# Patient Record
Sex: Male | Born: 1985 | Race: White | Hispanic: Yes | Marital: Single | Smoking: Never smoker
Health system: Southern US, Community
[De-identification: ages and names within clinical notes are randomized; demographics above are authoritative.]

## PROBLEM LIST (undated history)

## (undated) DIAGNOSIS — J45909 Unspecified asthma, uncomplicated: Secondary | ICD-10-CM

## (undated) DIAGNOSIS — N452 Orchitis: Secondary | ICD-10-CM

## (undated) DIAGNOSIS — N200 Calculus of kidney: Secondary | ICD-10-CM

## (undated) DIAGNOSIS — E559 Vitamin D deficiency, unspecified: Secondary | ICD-10-CM

## (undated) HISTORY — DX: Orchitis: N45.2

## (undated) HISTORY — DX: Unspecified asthma, uncomplicated: J45.909

## (undated) HISTORY — PX: EYE SURGERY: SHX253

## (undated) HISTORY — DX: Calculus of kidney: N20.0

## (undated) HISTORY — DX: Vitamin D deficiency, unspecified: E55.9

## (undated) HISTORY — PX: WISDOM TOOTH EXTRACTION: SHX21

---
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
## 2007-03-05 HISTORY — PX: VITRECTOMY: SHX106

## 2007-04-16 ENCOUNTER — Emergency Department (HOSPITAL_COMMUNITY): Admission: EM | Admit: 2007-04-16 | Discharge: 2007-04-17 | Payer: Self-pay | Admitting: Emergency Medicine

## 2007-10-22 ENCOUNTER — Emergency Department (HOSPITAL_COMMUNITY): Admission: EM | Admit: 2007-10-22 | Discharge: 2007-10-22 | Payer: Self-pay | Admitting: Emergency Medicine

## 2010-09-27 ENCOUNTER — Emergency Department (HOSPITAL_COMMUNITY): Payer: BC Managed Care – HMO

## 2010-09-27 ENCOUNTER — Emergency Department (HOSPITAL_COMMUNITY)
Admission: EM | Admit: 2010-09-27 | Discharge: 2010-09-27 | Disposition: A | Discharge status: Home / Self Care | Payer: BC Managed Care – HMO | Attending: Emergency Medicine | Admitting: Emergency Medicine

## 2010-09-27 DIAGNOSIS — R1032 Left lower quadrant pain: Secondary | ICD-10-CM | POA: Insufficient documentation

## 2010-09-27 DIAGNOSIS — J45909 Unspecified asthma, uncomplicated: Secondary | ICD-10-CM | POA: Insufficient documentation

## 2010-09-27 DIAGNOSIS — R110 Nausea: Secondary | ICD-10-CM | POA: Insufficient documentation

## 2010-09-27 DIAGNOSIS — R109 Unspecified abdominal pain: Secondary | ICD-10-CM | POA: Insufficient documentation

## 2010-09-27 DIAGNOSIS — N200 Calculus of kidney: Secondary | ICD-10-CM | POA: Insufficient documentation

## 2010-09-27 LAB — POCT I-STAT, CHEM 8
Creatinine, Ser: 1 mg/dL (ref 0.4–1.5)
Glucose, Bld: 99 mg/dL (ref 70–99)
HCT: 44 % (ref 39.0–52.0)
Hemoglobin: 15 g/dL (ref 13.0–17.0)
Potassium: 3.9 mEq/L (ref 3.5–5.1)
Sodium: 142 mEq/L (ref 135–145)
TCO2: 23 mmol/L (ref 0–100)

## 2010-09-27 LAB — DIFFERENTIAL
Basophils Absolute: 0 10*3/uL (ref 0.0–0.1)
Basophils Relative: 0 % (ref 0–1)
Eosinophils Relative: 1 % (ref 0–5)
Lymphocytes Relative: 15 % (ref 12–46)
Monocytes Absolute: 0.5 10*3/uL (ref 0.1–1.0)
Neutro Abs: 8.4 10*3/uL — ABNORMAL HIGH (ref 1.7–7.7)

## 2010-09-27 LAB — URINALYSIS, ROUTINE W REFLEX MICROSCOPIC
Leukocytes, UA: NEGATIVE
Protein, ur: 30 mg/dL — AB
Specific Gravity, Urine: 1.023 (ref 1.005–1.030)
Urine Glucose, Fasting: NEGATIVE mg/dL

## 2010-09-27 LAB — URINE MICROSCOPIC-ADD ON

## 2010-09-27 LAB — CBC
MCHC: 33.7 g/dL (ref 30.0–36.0)
RDW: 11.6 % (ref 11.5–15.5)
WBC: 10.7 10*3/uL — ABNORMAL HIGH (ref 4.0–10.5)

## 2010-12-20 NOTE — Op Note (Signed)
NAMEINDIGO, Alan Beck             ACCOUNT NO.:  [id number]   MEDICAL RECORD NO.:  [id number]          PATIENT TYPE:  INP   LOCATION:  1824                         FACILITY:  [facility]   PHYSICIAN:  Leonia Corona, M.D.  DATE OF BIRTH:  [birth year]   DATE OF PROCEDURE:  08/01/2005  DATE OF DISCHARGE:                                 OPERATIVE REPORT   PREOPERATIVE DIAGNOSIS:  Acute appendicitis.   POSTOPERATIVE DIAGNOSIS:  Acute appendicitis.   PROCEDURE:  Open appendectomy.   ANESTHESIA:  General laryngeal mask endotracheal anesthesia.   SURGEON:  Leonia Corona, M.D.   ASSISTANT:  Nurse.   INDICATIONS FOR PROCEDURE:  This 25 year old male child was evaluated for  lower abdominal pain which was suspicious for acute appendicitis.  The  diagnosis was confirmed on CT scan, hence the indication for the procedure.   DESCRIPTION OF PROCEDURE:  The patient was brought into the operating room  and placed supine on the operating table.  General endotracheal anesthesia  was given.  The right lower quadrant of the abdomen and the surrounding area  of the abdominal wall was cleaned, prepped, and draped in the usual manner.   The incision was made centered at McBurney's point at the right lower  quadrant and measured about 3 to 4 cm along the skin crease. The incision  was deepened through the subcutaneous tissues with electrocautery until the  external aponeurosis was reached.  The external oblique was incised in the  line of its fibers.  The internal oblique and transversus abdominis muscles  were split along its fibers with the help of a blunted hemostats and  retracted with the [employer] retractors.  The peritoneum was exposed which  was held between two hemostats and incised in between with scissors.  A  small opening into the peritoneal cavity was made.  The peritoneal fluid was  swabbed for cultures and sensitivity.  The opening into the peritoneal  cavity was enlarged with scissors.   The retractors were inserted.  The cecum  was identified, and tinea was followed which led to a swollen and inflamed  appendix.  The appendix was delivered out of the incision along with partial  delivery of the cecal wall.  With the help of a Babcock forceps, the  edematous and indurated mesoappendix was divided between clamps and ligated  using 2-0 silk until the base of the appendix was clear.  The base of the  appendix was crushed with a clamp and clamped above the base.  The base was  ligated using 2-0 Vicryl.  The appendix was divided above the ligature and  removed from the field.  The appendiceal stump mucosa was cauterized.  A  pursestring suture using 3-0 silk was placed on the cecal wall around the  base of the appendix, and the appendiceal stump was buried by tying the  pursestring suture.  The cecum was returned back into the peritoneal cavity.  Peritoneal lavage using warm normal saline was done until the returning  fluid was clear.  No oozing or bleeding was noted.  The abdomen was closed  in layers.  The  peritoneum was closed using 2-0 Vicryl running stitch.  The  external oblique and transverse abdominis muscles were approximated using a  single stitch of 2-0 Vicryl.  The external oblique aponeurosis was repaired  using 2-0 Vicryl interrupted stitch.  Approximately 20 cc of 0.25% Marcaine  with epinephrine was infiltrated in and around the incision for  postoperative pain control.  The skin was closed in two layers, the deep  subcutaneous layer using 4-0 Vicryl and the skin with 5-0 Monocryl  subcuticular stitch.  Steri-Strips were applied which were covered with  sterile gauze and Tegaderm dressing.   The patient tolerated the procedure very well, which was smooth and  uneventful.  The patient was later extubated and transported to the recovery  room in good and stable condition.      Leonia Corona, M.D.  Electronically Signed     SF/MEDQ  D:  08/01/2005  T:   08/01/2005  Job:  [id number]   cc:   Linward Headland, M.D.  Fax: [phone]

## 2011-04-28 LAB — POCT URINALYSIS DIP (DEVICE)
Bilirubin Urine: NEGATIVE
Glucose, UA: NEGATIVE
Nitrite: NEGATIVE
Operator id: 247071
Urobilinogen, UA: 1

## 2011-08-05 DIAGNOSIS — N452 Orchitis: Secondary | ICD-10-CM

## 2011-08-05 HISTORY — DX: Orchitis: N45.2

## 2016-08-26 ENCOUNTER — Encounter: Payer: Self-pay | Admitting: Internal Medicine

## 2016-08-26 ENCOUNTER — Ambulatory Visit (INDEPENDENT_AMBULATORY_CARE_PROVIDER_SITE_OTHER): Payer: 59 | Admitting: Internal Medicine

## 2016-08-26 VITALS — BP 112/76 | HR 80 | Temp 97.9°F | Resp 16 | Ht 69.5 in | Wt 153.8 lb

## 2016-08-26 DIAGNOSIS — Z0000 Encounter for general adult medical examination without abnormal findings: Secondary | ICD-10-CM | POA: Diagnosis not present

## 2016-08-26 DIAGNOSIS — R5383 Other fatigue: Secondary | ICD-10-CM

## 2016-08-26 DIAGNOSIS — Z131 Encounter for screening for diabetes mellitus: Secondary | ICD-10-CM

## 2016-08-26 DIAGNOSIS — Z111 Encounter for screening for respiratory tuberculosis: Secondary | ICD-10-CM | POA: Diagnosis not present

## 2016-08-26 DIAGNOSIS — Z136 Encounter for screening for cardiovascular disorders: Secondary | ICD-10-CM

## 2016-08-26 DIAGNOSIS — Z1211 Encounter for screening for malignant neoplasm of colon: Secondary | ICD-10-CM

## 2016-08-26 DIAGNOSIS — Z23 Encounter for immunization: Secondary | ICD-10-CM | POA: Diagnosis not present

## 2016-08-26 DIAGNOSIS — Z0001 Encounter for general adult medical examination with abnormal findings: Secondary | ICD-10-CM

## 2016-08-26 DIAGNOSIS — Z13220 Encounter for screening for lipoid disorders: Secondary | ICD-10-CM

## 2016-08-26 DIAGNOSIS — E559 Vitamin D deficiency, unspecified: Secondary | ICD-10-CM

## 2016-08-26 DIAGNOSIS — Z79899 Other long term (current) drug therapy: Secondary | ICD-10-CM

## 2016-08-26 NOTE — Patient Instructions (Signed)

## 2016-08-26 NOTE — Progress Notes (Signed)
[facility]   Lucky Cowboy, M.D.    Dyanne Carrel. Steffanie Dunn, P.A.-C      Terri Piedra, P.A.-C  [facility]                [address]                [place], [place]. [address] Telephone [phone] Telefax [phone] Annual  Screening/Preventative Visit  & Comprehensive Evaluation & Examination     This very nice 31 y.o. SWM presents for a Screening/Preventative Visit & comprehensive evaluation and management of multiple medical co-morbidities.  Patient is expectantly evaluated for elevated BP, Prediabetes, Hyperlipidemia and Vitamin D Deficiency.     Today's BP is at goal - 112/76. Patient denies any cardiac symptoms as chest pain, palpitations, shortness of breath, dizziness or ankle swelling.     Patient's hyperlipidemia is screened expectantly for abnormal lipids and  prediabetes and patient denies reactive hypoglycemic symptoms, visual blurring, diabetic polys or paresthesias.      Finally, patient has history of Vitamin D Deficiency in 2009 of "25".   Current meds/supplements - None  Allergies  Allergen Reactions  . Cephalosporins Rash   Past Medical History:  Diagnosis Date  . Asthma   . Chronic kidney disease 2012   kidney stones   . Orchitis, left 2013   Dr Isabel Caprice  . Vitamin D deficiency    Health Maintenance  Topic Date Due  . HIV Screening  10/28/2001  . TETANUS/TDAP  10/28/2005  . INFLUENZA VACCINE  03/04/2016   Immunization History  Administered Date(s) Administered  . PPD Test 08/26/2016  . Tdap 08/26/2016   Past Surgical History:  Procedure Laterality Date  . APPENDECTOMY  2007  . EYE SURGERY Right 03/2007   Rt Retinal Detachment & Rt CE/IOL   Family History  Problem Relation Age of Onset  . Cancer Maternal Aunt   . Cancer Maternal Uncle   . Hypertension Paternal Grandmother   . Hypertension Paternal Grandfather   . Cancer Cousin   . Seizures Cousin    Social History    Social History  . Marital status: Single  . Years of education: N/A   Occupational History  .  employer x 4  Years as an Transport planner at [employer]   Social History Main Topics  . Smoking status: No  . Smokeless tobacco: No  . Alcohol use No  . Drug use: No  . Sexual activity: Not on file    ROS Constitutional: Denies fever, chills, weight loss/gain, headaches, insomnia,  night sweats or change in appetite. Does c/o fatigue. Eyes: Denies redness, blurred vision, diplopia, discharge, itchy or watery eyes.  ENT: Denies discharge, congestion, post nasal drip, epistaxis, sore throat, earache, hearing loss, dental pain, Tinnitus, Vertigo, Sinus pain or snoring.  Cardio: Denies chest pain, palpitations, irregular heartbeat, syncope, dyspnea, diaphoresis, orthopnea, PND, claudication or edema Respiratory: denies cough, dyspnea, DOE, pleurisy, hoarseness, laryngitis or wheezing.  Gastrointestinal: Denies dysphagia, heartburn, reflux, water brash, pain, cramps, nausea, vomiting, bloating, diarrhea, constipation, hematemesis, melena, hematochezia, jaundice or hemorrhoids Genitourinary: Denies dysuria, frequency, urgency, nocturia, hesitancy, discharge, hematuria or flank pain Musculoskeletal: Denies arthralgia, myalgia, stiffness, Jt. Swelling, pain, limp or strain/sprain. Denies Falls. Skin: Denies puritis, rash, hives, warts, acne, eczema or change in skin lesion Neuro: No weakness, tremor, incoordination, spasms, paresthesia or pain Psychiatric: Denies confusion, memory loss or sensory loss. Denies Depression. Endocrine: Denies change in weight, skin, hair change, nocturia, and  paresthesia, diabetic polys, visual blurring or hyper / hypo glycemic episodes.  Heme/Lymph: No excessive bleeding, bruising or enlarged lymph nodes.  Physical Exam  BP 112/76   Pulse 80   Temp 97.9 F (36.6 C)   Resp 16   Ht 5' 9.5" (1.765 m)   Wt 153 lb 12.8 oz (69.8 kg)   BMI 22.39 kg/m   General  Appearance: Well nourished, in no apparent distress.  Eyes: PERRLA, EOMs, conjunctiva no swelling or erythema, normal fundi and vessels. Sinuses: No frontal/maxillary tenderness ENT/Mouth: EACs patent / TMs  nl. Nares clear without erythema, swelling, mucoid exudates. Oral hygiene is good. No erythema, swelling, or exudate. Tongue normal, non-obstructing. Tonsils not swollen or erythematous. Hearing normal.  Neck: Supple, thyroid normal. No bruits, nodes or JVD. Respiratory: Respiratory effort normal.  BS equal and clear bilateral without rales, rhonci, wheezing or stridor. Cardio: Heart sounds are normal with regular rate and rhythm and no murmurs, rubs or gallops. Peripheral pulses are normal and equal bilaterally without edema. No aortic or femoral bruits. Chest: symmetric with normal excursions and percussion.  Abdomen: Soft, with Nl bowel sounds. Nontender, no guarding, rebound, hernias, masses, or organomegaly.  Lymphatics: Non tender without lymphadenopathy.  Genitourinary: No hernias.Testes nl. DRE - deferred for age Musculoskeletal: Full ROM all peripheral extremities, joint stability, 5/5 strength, and normal gait. Skin: Warm and dry without rashes, lesions, cyanosis, clubbing or  ecchymosis.  Neuro: Cranial nerves intact, reflexes equal bilaterally. Normal muscle tone, no cerebellar symptoms. Sensation intact.  Pysch: Alert and oriented X 3 with normal affect, insight and judgment appropriate.   Assessment and Plan  1. Annual Preventative/Screening Exam    2. Hypertension screen  - Microalbumin / creatinine urine ratio - EKG 12-Lead - Urinalysis, Routine w reflex microscopic - BASIC METABOLIC PANEL WITH GFR - TSH  3. Screening cholesterol level  - EKG 12-Lead - Hepatic function panel - Lipid panel - TSH  4. Vitamin D deficiency  - Hemoglobin A1c - Insulin, random  5. Colon cancer screening  - POC Hemoccult Bld/Stl    6. Screening for ischemic heart  disease  - EKG 12-Lead  7. Fatigue, unspecified type  - Vitamin B12 - Testosterone - CBC with Differential/Platelet - TSH - Iron and TIBC  8. Medication management  - Urinalysis, Routine w reflex microscopic - CBC with Differential/Platelet - BASIC METABOLIC PANEL WITH GFR - Hepatic function panel - Magnesium  9. Screening examination for pulmonary tuberculosis  - PPD  10. Need for prophylactic vaccination with combined diphtheria-tetanus-pertussis (DTP) vaccine  - Tdap vaccine greater than or equal to 7yo IM        Continue prudent diet as discussed, weight control, BP monitoring, regular exercise, and medications as discussed.  Discussed med effects and SE's. Routine screening labs and tests as requested with regular follow-up as recommended. Over 40 minutes of exam, counseling, chart review and high complex critical decision making was performed

## 2016-08-27 LAB — MAGNESIUM: MAGNESIUM: 2.1 mg/dL (ref 1.5–2.5)

## 2016-08-27 LAB — URINALYSIS, ROUTINE W REFLEX MICROSCOPIC
BILIRUBIN URINE: NEGATIVE
GLUCOSE, UA: NEGATIVE
HGB URINE DIPSTICK: NEGATIVE
KETONES UR: NEGATIVE
Leukocytes, UA: NEGATIVE
Nitrite: NEGATIVE
PROTEIN: NEGATIVE
Specific Gravity, Urine: 1.017 (ref 1.001–1.035)
pH: 7.5 (ref 5.0–8.0)

## 2016-08-27 LAB — HEPATIC FUNCTION PANEL
ALBUMIN: 4.5 g/dL (ref 3.6–5.1)
ALK PHOS: 50 U/L (ref 40–115)
ALT: 9 U/L (ref 9–46)
AST: 11 U/L (ref 10–40)
BILIRUBIN TOTAL: 0.5 mg/dL (ref 0.2–1.2)
Bilirubin, Direct: 0.1 mg/dL (ref <=0.2)
Indirect Bilirubin: 0.4 mg/dL (ref 0.2–1.2)
TOTAL PROTEIN: 7 g/dL (ref 6.1–8.1)

## 2016-08-27 LAB — BASIC METABOLIC PANEL WITH GFR
BUN: 16 mg/dL (ref 7–25)
CHLORIDE: 103 mmol/L (ref 98–110)
CO2: 24 mmol/L (ref 20–31)
CREATININE: 0.94 mg/dL (ref 0.60–1.35)
Calcium: 9.6 mg/dL (ref 8.6–10.3)
GFR, Est Non African American: >89 mL/min (ref >=60)
GLUCOSE: 89 mg/dL (ref 65–99)
POTASSIUM: 4 mmol/L (ref 3.5–5.3)
Sodium: 143 mmol/L (ref 135–146)

## 2016-08-27 LAB — CBC WITH DIFFERENTIAL/PLATELET
BASOS ABS: 67 {cells}/uL (ref 0–200)
BASOS PCT: 1 %
EOS ABS: 268 {cells}/uL (ref 15–500)
Eosinophils Relative: 4 %
HEMATOCRIT: 45.2 % (ref 38.5–50.0)
Hemoglobin: 15.2 g/dL (ref 13.2–17.1)
LYMPHS PCT: 31 %
Lymphs Abs: 2077 cells/uL (ref 850–3900)
MCH: 30.2 pg (ref 27.0–33.0)
MCHC: 33.6 g/dL (ref 32.0–36.0)
MCV: 89.7 fL (ref 80.0–100.0)
MONO ABS: 469 {cells}/uL (ref 200–950)
MONOS PCT: 7 %
MPV: 9.8 fL (ref 7.5–12.5)
NEUTROS PCT: 57 %
Neutro Abs: 3819 cells/uL (ref 1500–7800)
PLATELETS: 326 10*3/uL (ref 140–400)
RBC: 5.04 MIL/uL (ref 4.20–5.80)
RDW: 13.2 % (ref 11.0–15.0)
WBC: 6.7 10*3/uL (ref 3.8–10.8)

## 2016-08-27 LAB — HEMOGLOBIN A1C
Hgb A1c MFr Bld: 4.9 % (ref <5.7)
MEAN PLASMA GLUCOSE: 94 mg/dL

## 2016-08-27 LAB — LIPID PANEL
Cholesterol: 137 mg/dL (ref <200)
HDL: 39 mg/dL — ABNORMAL LOW (ref >40)
LDL CALC: 57 mg/dL (ref <100)
Total CHOL/HDL Ratio: 3.5 Ratio (ref <5.0)
Triglycerides: 205 mg/dL — ABNORMAL HIGH (ref <150)
VLDL: 41 mg/dL — ABNORMAL HIGH (ref <30)

## 2016-08-27 LAB — INSULIN, RANDOM: INSULIN: 35 u[IU]/mL — AB (ref 2.0–19.6)

## 2016-08-27 LAB — IRON AND TIBC
%SAT: 33 % (ref 15–60)
IRON: 104 ug/dL (ref 50–195)
TIBC: 320 ug/dL (ref 250–425)
UIBC: 216 ug/dL (ref 125–400)

## 2016-08-27 LAB — VITAMIN B12: VITAMIN B 12: 447 pg/mL (ref 200–1100)

## 2016-08-27 LAB — VITAMIN D 25 HYDROXY (VIT D DEFICIENCY, FRACTURES): VIT D 25 HYDROXY: 17 ng/mL — AB (ref 30–100)

## 2016-08-27 LAB — MICROALBUMIN / CREATININE URINE RATIO
CREATININE, URINE: 105 mg/dL (ref 20–370)
MICROALB UR: 0.4 mg/dL
MICROALB/CREAT RATIO: 4 ug/mg{creat} (ref <30)

## 2016-08-27 LAB — TESTOSTERONE: Testosterone: 330 ng/dL (ref 250–827)

## 2016-08-27 LAB — TSH: TSH: 1.77 m[IU]/L (ref 0.40–4.50)

## 2016-08-29 ENCOUNTER — Encounter: Payer: Self-pay | Admitting: *Deleted

## 2016-09-19 ENCOUNTER — Encounter: Payer: Self-pay | Admitting: Internal Medicine

## 2016-10-09 DIAGNOSIS — H04123 Dry eye syndrome of bilateral lacrimal glands: Secondary | ICD-10-CM | POA: Diagnosis not present

## 2016-10-09 DIAGNOSIS — H2701 Aphakia, right eye: Secondary | ICD-10-CM | POA: Diagnosis not present

## 2016-10-09 DIAGNOSIS — H43812 Vitreous degeneration, left eye: Secondary | ICD-10-CM | POA: Diagnosis not present

## 2016-12-25 ENCOUNTER — Encounter: Payer: Self-pay | Admitting: Physician Assistant

## 2016-12-25 ENCOUNTER — Ambulatory Visit (INDEPENDENT_AMBULATORY_CARE_PROVIDER_SITE_OTHER): Payer: 59 | Admitting: Physician Assistant

## 2016-12-25 VITALS — BP 116/68 | HR 88 | Temp 97.9°F | Resp 16 | Ht 69.5 in | Wt 153.4 lb

## 2016-12-25 DIAGNOSIS — K5900 Constipation, unspecified: Secondary | ICD-10-CM | POA: Diagnosis not present

## 2016-12-25 DIAGNOSIS — K2100 Gastro-esophageal reflux disease with esophagitis, without bleeding: Secondary | ICD-10-CM

## 2016-12-25 DIAGNOSIS — R2991 Unspecified symptoms and signs involving the musculoskeletal system: Secondary | ICD-10-CM | POA: Diagnosis not present

## 2016-12-25 DIAGNOSIS — R51 Headache: Secondary | ICD-10-CM | POA: Diagnosis not present

## 2016-12-25 DIAGNOSIS — R0600 Dyspnea, unspecified: Secondary | ICD-10-CM | POA: Diagnosis not present

## 2016-12-25 LAB — CBC WITH DIFFERENTIAL/PLATELET
BASOS ABS: 62 {cells}/uL (ref 0–200)
BASOS PCT: 1 %
EOS PCT: 8 %
Eosinophils Absolute: 496 cells/uL (ref 15–500)
HEMATOCRIT: 42.5 % (ref 38.5–50.0)
HEMOGLOBIN: 14.5 g/dL (ref 13.2–17.1)
LYMPHS ABS: 1860 {cells}/uL (ref 850–3900)
Lymphocytes Relative: 30 %
MCH: 30.4 pg (ref 27.0–33.0)
MCHC: 34.1 g/dL (ref 32.0–36.0)
MCV: 89.1 fL (ref 80.0–100.0)
MONOS PCT: 8 %
MPV: 9.3 fL (ref 7.5–12.5)
Monocytes Absolute: 496 cells/uL (ref 200–950)
NEUTROS ABS: 3286 {cells}/uL (ref 1500–7800)
Neutrophils Relative %: 53 %
Platelets: 278 10*3/uL (ref 140–400)
RBC: 4.77 MIL/uL (ref 4.20–5.80)
RDW: 13.3 % (ref 11.0–15.0)
WBC: 6.2 10*3/uL (ref 3.8–10.8)

## 2016-12-25 LAB — BASIC METABOLIC PANEL WITH GFR
BUN: 16 mg/dL (ref 7–25)
CO2: 24 mmol/L (ref 20–31)
Calcium: 9.3 mg/dL (ref 8.6–10.3)
Chloride: 109 mmol/L (ref 98–110)
Creat: 1.11 mg/dL (ref 0.60–1.35)
GFR, Est Non African American: 89 mL/min (ref >=60)
GLUCOSE: 91 mg/dL (ref 65–99)
POTASSIUM: 4.2 mmol/L (ref 3.5–5.3)
Sodium: 143 mmol/L (ref 135–146)

## 2016-12-25 LAB — HEPATIC FUNCTION PANEL
ALK PHOS: 49 U/L (ref 40–115)
ALT: 9 U/L (ref 9–46)
AST: 11 U/L (ref 10–40)
Albumin: 4.3 g/dL (ref 3.6–5.1)
BILIRUBIN INDIRECT: 0.5 mg/dL (ref 0.2–1.2)
Bilirubin, Direct: 0.1 mg/dL (ref <=0.2)
TOTAL PROTEIN: 6.8 g/dL (ref 6.1–8.1)
Total Bilirubin: 0.6 mg/dL (ref 0.2–1.2)

## 2016-12-25 NOTE — Progress Notes (Signed)
Subjective:    Patient ID: Alan Beck, male    DOB: 05-01-1986, 31 y.o.   MRN: [id number]  HPI 31 y.o. WM with history of asthma presents tightness/choking issue at neck and through his chest x 2 weeks. Feels lump in his throat, will feel burning sensation in his throat like breathing in cold air, will eat RX bar/diet pepsi for breakfast and will be sitting at his desk, nonexertional, has trouble getting deep breath, no sweating, no CP, no dizziness. Constipation 2 weeks ago, on senna plus and miralax that helped.  No aleve, ibuprofen. 3 weeks ago had wedding with some beer there but no other ETOH use.   He has pectas exacavum, long fingers, history of retinal detachment, arm length is greather than height. No family history of marfan's but has several physical features.    Blood pressure 116/68, pulse 88, temperature 97.9 F (36.6 C), resp. rate 16, height 5' 9.5" (1.765 m), weight 153 lb 6.4 oz (69.6 kg), SpO2 98 %.  Medications No current outpatient prescriptions on file prior to visit.   No current facility-administered medications on file prior to visit.     Problem list He  does not have a problem list on file.   Review of Systems  Constitutional: Negative for chills, fatigue and fever.  HENT: Negative.   Respiratory: Positive for chest tightness. Negative for apnea, cough, choking, shortness of breath, wheezing and stridor.   Cardiovascular: Negative for chest pain, palpitations and leg swelling.  Gastrointestinal: Positive for constipation and nausea. Negative for abdominal distention, abdominal pain, anal bleeding, blood in stool, diarrhea, rectal pain and vomiting.  Genitourinary: Negative.   Musculoskeletal: Negative.   Skin: Negative.  Negative for rash.  Neurological: Negative.        Objective:   Physical Exam  Constitutional: He is oriented to person, place, and time. He appears well-developed and well-nourished. No distress.  HENT:  Head: Normocephalic  and atraumatic.  Eyes: Conjunctivae are normal. Pupils are equal, round, and reactive to light.  Wears glasses  Neck: Normal range of motion. Neck supple.  Cardiovascular: Normal rate, regular rhythm, normal heart sounds and intact distal pulses.   No murmur heard. Pulmonary/Chest: Effort normal and breath sounds normal. He has no wheezes. He has no rales.  Pectus excavatum  Abdominal: Soft. Bowel sounds are normal. He exhibits no mass. There is tenderness (periumbilical and epigasric). There is no rebound and no guarding.  Musculoskeletal: Normal range of motion. He exhibits no tenderness.  + hand and thumb test, wing span longer than height, double jointed/laxity.  Lymphadenopathy:    He has no cervical adenopathy.  Neurological: He is alert and oriented to person, place, and time. No cranial nerve deficit.  Skin: Skin is warm and dry. No rash noted.      Assessment & Plan:  1. Constipation, unspecified constipation type Increase fiber, may need IBS-C med If not better will refer to GI - Celiac Disease Comprehensive Panel with Reflexes  2. Gastroesophageal reflux disease with esophagitis Treat with PPI x 2 weeks, diet given, follow up if worse  3. Dyspnea, unspecified type Nonexertional, ? 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 The patient voices understanding of current treatment options and is in agreement with the current care plan.The patient knows to call the clinic with any problems, questions or concerns or go to the ER if any further progression of symptoms.

## 2016-12-25 NOTE — Patient Instructions (Addendum)
Food Choices for Gastroesophageal Reflux Disease, Adult When you have gastroesophageal reflux disease (GERD), the foods you eat and your eating habits are very important. Choosing the right foods can help ease your discomfort. What guidelines do I need to follow?  Choose fruits, vegetables, whole grains, and low-fat dairy products.  Choose low-fat meat, fish, and poultry.  Limit fats such as oils, salad dressings, butter, nuts, and avocado.  Keep a food diary. This helps you identify foods that cause symptoms.  Avoid foods that cause symptoms. These may be different for everyone.  Eat small meals often instead of 3 large meals a day.  Eat your meals slowly, in a place where you are relaxed.  Limit fried foods.  Cook foods using methods other than frying.  Avoid drinking alcohol.  Avoid drinking large amounts of liquids with your meals.  Avoid bending over or lying down until 2-3 hours after eating. What foods are not recommended? These are some foods and drinks that may make your symptoms worse: Vegetables  Tomatoes. Tomato juice. Tomato and spaghetti sauce. Chili peppers. Onion and garlic. Horseradish. Fruits  Oranges, grapefruit, and lemon (fruit and juice). Meats  High-fat meats, fish, and poultry. This includes hot dogs, ribs, ham, sausage, salami, and bacon. Dairy  Whole milk and chocolate milk. Sour cream. Cream. Butter. Ice cream. Cream cheese. Drinks  Coffee and tea. Bubbly (carbonated) drinks or energy drinks. Condiments  Hot sauce. Barbecue sauce. Sweets/Desserts  Chocolate and cocoa. Donuts. Peppermint and spearmint. Fats and Oils  High-fat foods. This includes JamaicaFrench fries and potato chips. Other  Vinegar. Strong spices. This includes black pepper, white pepper, red pepper, cayenne, curry powder, cloves, ginger, and chili powder. The items listed above may not be a complete list of foods and drinks to avoid. Contact your dietitian for more information.    This information is not intended to replace advice given to you by your health care provider. Make sure you discuss any questions you have with your health care provider. Document Released: 01/20/2012 Document Revised: 12/27/2015 Document Reviewed: 05/25/2013 Elsevier Interactive Patient Education  2017 Elsevier Inc.   Marfan Syndrome Marfan syndrome is a rare connective tissue disorder that is caused by changes in a certain gene (genetic mutation). Connective tissue supports the body's tissues and organs. Marfan syndrome makes the connective tissues weaker. It most commonly affects the bones, joints, eyes, heart, and blood vessels. Marfan syndrome can lead to serious complications in the heart, blood vessels, eyes, and lungs. What are the causes? This condition is caused by a genetic mutation. The mutation may pass from parent to child (be inherited) or may happen without a known cause. What increases the risk? You may be more likely to develop this condition if you have a family history of Marfan syndrome. What are the signs or symptoms? Symptoms of Marfan syndrome may vary from mild to severe. Common signs and symptoms include:  Long arms and legs.  Long, thin fingers.  Curvature of the spine (scoliosis).  Nearsightedness and other vision problems.  A chest that sticks out (pectus carinatum) or looks sunken in (pectus excavatum).  Flat feet.  Crowded teeth.  Stretch marks on the skin.  Headaches.  Pain in the back, legs, or abdomen.  Loose joints. Severe signs and symptoms may include:  Heart problems, especially with:  The main artery that supplies blood from the heart to the rest of the body (aorta).  Heart valves.  Eye problems, such as:  Dislocated lens.  Cataracts.  Glaucoma.  Retinal detachment.  Lung collapse (spontaneous pneumothorax). How is this diagnosed? This condition may be diagnosed based on:  Your medical history.  Blood tests. This may  include genetic testing.  A physical exam. This includes:  An eye exam. Your eye lenses may be checked to see whether they are in place (slit-lamp exam).  A heart exam. This may include having tests, such as:  Echocardiogram. This test uses sound waves (ultrasound) to create an image of the heart.  Electrocardiogram (ECG). This test records the electrical impulses of the heart.  MRI.  CT scan.  Chest X-ray. How is this treated? There is no cure for this condition, but treatment can help you manage symptoms and help prevent or treat any heart or vision conditions that you may have. Treatment for Marfan syndrome involves a team of health care providers, and it may include one or more of the following:  Medicines:  To lower your blood pressure.  To reduce strain on your heart.  To reduce pain.  Eyeglasses, contact lenses, or eye surgery.  Monitoring your condition for any heart changes. Monitoring may be done in the hospital or through regular checkups with a health care provider.  A back brace, to help with scoliosis.  Placing a tube in your chest, if you had a collapsed lung.  Surgery to correct:  Heart problems.  Scoliosis.  The formation of your chest.  A collapsed lung.  Eye problems.  Skeletal problems. Follow these instructions at home: Health care   If you had heart valve surgery, tell all health care providers including your dentist that you had this surgery.  Get an eye exam every year, or as often as directed by your health care provider.  Visit the dentist two times a year. Brush your teeth two times a day, and floss at least once a day.  Consider joining a support group for people with Marfan syndrome. This may help you to cope with any stress or issues that are related to your condition. Ask your health care provider for more information. Activity   Exercise as directed by your health care provider. Do not take part in high-risk activities or  contact sports, such as football or soccer. Ask your health care provider what activities are safe for you.  Do not drive or use heavy machinery while taking prescription pain medicine. General instructions   Take over-the-counter and prescription medicines only as told by your health care provider.  Do not use any products that contain nicotine or tobacco, such as cigarettes and e-cigarettes. If you need help quitting, ask your health care provider.  If you are planning to become pregnant, talk with your health care provider. A pregnancy can increase stress on your heart.  Wear your back brace, supportive shoes, or foot inserts as directed by your health care provider, if this applies.  Keep all follow-up visits as told by your health care provider. This is important. Where to find more information:  The Marfan Foundation: https://www.franklin-jones.com/  [employer] Rare [employer]: https://rarediseases.org/rare-diseases/marfan-syndrome/  [employer]: https://www.willis-schwartz.biz/ Contact a health care provider if:  You have new symptoms.  Your symptoms get worse instead of better.  Your legs are numb or painful.  You have tingling in your legs or arms.  You have a headache.  You have fatigue.  You have an unexplained fever.  You are snoring more than usual, or you are having difficulty sleeping. Get help right away if:  You have sudden or severe pain in your chest, back, or abdomen.  You have trouble breathing or shortness of breath.  You faint.  You have changes in your vision, such as bright flashes, blurred vision, or blindness.  Your heart is beating rapidly or irregularly.  You have sudden pain on one side of your body. These symptoms may represent a serious problem that is an emergency. Do not wait to see if the symptoms will go away. Get medical help right away. Call your  local emergency services (911 in the [place] Do not drive yourself to the hospital. Summary  Marfan syndrome is a rare connective tissue disorder that is caused by changes in a certain gene (genetic mutation).  Marfan syndrome weakens connective tissue, which supports the body's tissues and organs.  There is no cure for this condition, but treatment can help you manage symptoms and help prevent or treat any heart or vision conditions that you may have. This information is not intended to replace advice given to you by your health care provider. Make sure you discuss any questions you have with your health care provider. Document Released: 10/27/2000 Document Revised: 06/11/2016 Document Reviewed: 06/11/2016 Elsevier Interactive Patient Education  2017 [employer].

## 2016-12-26 ENCOUNTER — Ambulatory Visit (HOSPITAL_COMMUNITY)
Admission: RE | Admit: 2016-12-26 | Discharge: 2016-12-26 | Disposition: A | Discharge status: Home / Self Care | Payer: 59 | Source: Ambulatory Visit | Attending: Physician Assistant | Admitting: Physician Assistant

## 2016-12-26 DIAGNOSIS — R0600 Dyspnea, unspecified: Secondary | ICD-10-CM | POA: Insufficient documentation

## 2016-12-26 DIAGNOSIS — J45909 Unspecified asthma, uncomplicated: Secondary | ICD-10-CM | POA: Diagnosis not present

## 2016-12-26 LAB — CELIAC DISEASE COMPREHENSIVE PANEL WITH REFLEXES
IgA: 243 mg/dL (ref 81–463)
TISSUE TRANSGLUTAMINASE AB, IGA: 1 U/mL (ref <4)

## 2016-12-26 LAB — TSH: TSH: 0.9 mIU/L (ref 0.40–4.50)

## 2016-12-26 LAB — HOMOCYSTEINE: Homocysteine: 10.8 umol/L (ref <11.4)

## 2016-12-26 NOTE — Progress Notes (Signed)
LVM for pt to return office call for LAB results.

## 2016-12-26 NOTE — Progress Notes (Signed)
Pt aware of lab results & voiced understanding of those results. Per pt's request lab results were mailed to pt.

## 2017-01-22 ENCOUNTER — Encounter: Payer: Self-pay | Admitting: Physician Assistant

## 2017-01-22 ENCOUNTER — Other Ambulatory Visit: Payer: Self-pay | Admitting: Physician Assistant

## 2017-01-22 MED ORDER — ALBUTEROL SULFATE HFA 108 (90 BASE) MCG/ACT IN AERS: 2.0000 | INHALATION_SPRAY | RESPIRATORY_TRACT | 0 refills | Status: DC | PRN

## 2017-01-22 NOTE — Progress Notes (Signed)
Sent in albuterol

## 2017-02-10 ENCOUNTER — Encounter: Payer: Self-pay | Admitting: Physician Assistant

## 2017-03-17 ENCOUNTER — Encounter: Payer: Self-pay | Admitting: Physician Assistant

## 2017-03-18 ENCOUNTER — Encounter: Payer: Self-pay | Admitting: Internal Medicine

## 2017-03-18 ENCOUNTER — Other Ambulatory Visit: Payer: Self-pay | Admitting: Physician Assistant

## 2017-03-18 DIAGNOSIS — K2100 Gastro-esophageal reflux disease with esophagitis, without bleeding: Secondary | ICD-10-CM

## 2017-05-20 ENCOUNTER — Encounter: Payer: Self-pay | Admitting: Internal Medicine

## 2017-05-20 ENCOUNTER — Ambulatory Visit (INDEPENDENT_AMBULATORY_CARE_PROVIDER_SITE_OTHER): Payer: 59 | Admitting: Internal Medicine

## 2017-05-20 VITALS — BP 92/66 | HR 68 | Ht 69.25 in | Wt 152.0 lb

## 2017-05-20 DIAGNOSIS — F458 Other somatoform disorders: Secondary | ICD-10-CM

## 2017-05-20 DIAGNOSIS — R0989 Other specified symptoms and signs involving the circulatory and respiratory systems: Secondary | ICD-10-CM

## 2017-05-20 DIAGNOSIS — K219 Gastro-esophageal reflux disease without esophagitis: Secondary | ICD-10-CM | POA: Diagnosis not present

## 2017-05-20 DIAGNOSIS — R198 Other specified symptoms and signs involving the digestive system and abdomen: Secondary | ICD-10-CM

## 2017-05-20 NOTE — Patient Instructions (Signed)
You have been scheduled for an endoscopy. Please follow written instructions given to you at your visit today. If you use inhalers (even only as needed), please bring them with you on the day of your procedure. Your physician has requested that you go to www.startemmi.com and enter the access code given to you at your visit today. This web site gives a general overview about your procedure. However, you should still follow specific instructions given to you by our office regarding your preparation for the procedure.  Continue omeprazole 20 mg daily.   If you are age 30 or older, your body mass index should be between 23-30. Your Body mass index is 22.28 kg/m. If this is out of the aforementioned range listed, please consider follow up with your Primary Care Provider.  If you are age 30 or younger, your body mass index should be between 19-25. Your Body mass index is 22.28 kg/m. If this is out of the aformentioned range listed, please consider follow up with your Primary Care Provider.

## 2017-05-20 NOTE — Progress Notes (Signed)
Patient ID: [name], male   DOB: 1986/01/08, 31 y.o.   MRN: [id number]  HPI: Alan Beck is a 31 year old male with a past medical history of asthma, kidney stones who is seen in consultation at the request of Quentin Mulling, PA-C to evaluate reflux and globus sensation. He is here alone today.  He reports that he developed globus sensation or "lump in my throat" over the last 6-8 months. He's also noticed nocturnal heartburn. He had some hoarseness and issues with throat clearing. He initially tried Zantac which did not help. He then took a 14 day course of omeprazole and started to notice benefit but stopped due to package recommendation for 14 days only. He does have a issue with belching and a burping sensation in his mid and upper chest. He then took omeprazole for a longer period nearly a month and felt well without the symptoms mentioned above. He denies dysphagia and odynophagia. He said some mild epigastric discomfort but no nausea or vomiting. Back in the summer he was having some alternating loose stools and constipation but stools have been more normal recently. He denies blood in his stool and melena.  The patient had an appendectomy around age 59  His dad has a history of reflux and is status post Nissen fundoplication. He has a cousin with celiac disease.  Past Medical History:  Diagnosis Date  . Asthma   . Kidney stones   . Orchitis, left 2013   Dr Isabel Caprice  . Vitamin D deficiency     Past Surgical History:  Procedure Laterality Date  . APPENDECTOMY  2007  . EYE SURGERY Left    Lens removed  . VITRECTOMY Left 03/2007   Rt Retinal Detachment & Rt CE/IOL  . WISDOM TOOTH EXTRACTION      Outpatient Medications Prior to Visit  Medication Sig Dispense Refill  . Cholecalciferol (VITAMIN D PO) Take by mouth.    Marland Kitchen albuterol (VENTOLIN HFA) 108 (90 Base) MCG/ACT inhaler Inhale 2 puffs into the lungs every 4 (four) hours as needed for wheezing or shortness of breath. 1  Inhaler 0   No facility-administered medications prior to visit.     Allergies  Allergen Reactions  . Cephalosporins Rash    Family History  Problem Relation Age of Onset  . GER disease Father   . Colon cancer Maternal Aunt   . Cancer Maternal Uncle        type unknown  . Hypertension Paternal Grandmother   . Breast cancer Paternal Grandmother   . Diabetes Paternal Grandmother   . Kidney disease Paternal Grandmother   . Hypertension Paternal Grandfather   . Cancer Paternal Grandfather        type unknown  . Cancer Cousin        type unknown  . Seizures Cousin   . Prostate cancer Maternal Grandfather     Social History  Substance Use Topics  . Smoking status: Never Smoker  . Smokeless tobacco: Never Used  . Alcohol use Yes     Comment: rarely    ROS: As per history of present illness, otherwise negative  BP 92/66 (BP Location: Left Arm, Patient Position: Sitting, Cuff Size: Normal)   Pulse 68   Ht 5' 9.25" (1.759 m) Comment: height measured without shoes  Wt 152 lb (68.9 kg)   BMI 22.28 kg/m  Constitutional: Well-developed and well-nourished. No distress. HEENT: Normocephalic and atraumatic. Oropharynx is clear and moist. Conjunctivae are normal.  No scleral icterus. Neck: Neck supple.  Trachea midline. Cardiovascular: Normal rate, regular rhythm and intact distal pulses. No M/R/G Pulmonary/chest: Effort normal and breath sounds normal. No wheezing, rales or rhonchi. Abdominal: Soft, nontender, nondistended. Bowel sounds active throughout. There are no masses palpable. No hepatosplenomegaly. Extremities: no clubbing, cyanosis, or edema Neurological: Alert and oriented to person place and time. Skin: Skin is warm and dry.  Psychiatric: Normal mood and affect. Behavior is normal.  RELEVANT LABS AND IMAGING: CBC    Component Value Date/Time   WBC 6.2 12/25/2016 1746   RBC 4.77 12/25/2016 1746   HGB 14.5 12/25/2016 1746   HCT 42.5 12/25/2016 1746   PLT 278  12/25/2016 1746   MCV 89.1 12/25/2016 1746   MCH 30.4 12/25/2016 1746   MCHC 34.1 12/25/2016 1746   RDW 13.3 12/25/2016 1746   LYMPHSABS 1,860 12/25/2016 1746   MONOABS 496 12/25/2016 1746   EOSABS 496 12/25/2016 1746   BASOSABS 62 12/25/2016 1746    CMP     Component Value Date/Time   NA 143 12/25/2016 1746   K 4.2 12/25/2016 1746   CL 109 12/25/2016 1746   CO2 24 12/25/2016 1746   GLUCOSE 91 12/25/2016 1746   BUN 16 12/25/2016 1746   CREATININE 1.11 12/25/2016 1746   CALCIUM 9.3 12/25/2016 1746   PROT 6.8 12/25/2016 1746   ALBUMIN 4.3 12/25/2016 1746   AST 11 12/25/2016 1746   ALT 9 12/25/2016 1746   ALKPHOS 49 12/25/2016 1746   BILITOT 0.6 12/25/2016 1746   GFRNONAA 89 12/25/2016 1746   GFRAA >89 12/25/2016 1746   TTG neg, IgA normal  Lab Results  Component Value Date   TSH 0.90 12/25/2016    ASSESSMENT/PLAN:  [age] year old male with a past medical history of asthma, kidney stones who is seen in consultation at the request of Quentin Mullingmanda Collier, PA-C to evaluate reflux and globus sensation.  1. GERD with globus sensation -- symptoms consistent with acid reflux disease which have improved with PPI. No great response to ranitidine therapy. He has a history of asthma and I would like to rule out eosinophilic esophagitis. We'll evaluate further with upper endoscopy. We discussed the risk benefits and alternatives and he is agreeable and wishes to proceed. In the meantime I recommended he continue omeprazole 20 mg daily on an ongoing basis for now.    ZO:XWRUEAVCc:Collier, ManawaAmanda, Pa-c [address] [place], [place] [id number]

## 2017-05-26 ENCOUNTER — Encounter: Payer: Self-pay | Admitting: Internal Medicine

## 2017-05-26 ENCOUNTER — Ambulatory Visit (AMBULATORY_SURGERY_CENTER): Payer: 59 | Admitting: Internal Medicine

## 2017-05-26 VITALS — BP 103/53 | HR 69 | Temp 98.6°F | Resp 17 | Ht 69.0 in | Wt 152.0 lb

## 2017-05-26 DIAGNOSIS — K219 Gastro-esophageal reflux disease without esophagitis: Secondary | ICD-10-CM

## 2017-05-26 DIAGNOSIS — K2990 Gastroduodenitis, unspecified, without bleeding: Secondary | ICD-10-CM

## 2017-05-26 DIAGNOSIS — K2970 Gastritis, unspecified, without bleeding: Secondary | ICD-10-CM

## 2017-05-26 DIAGNOSIS — K3189 Other diseases of stomach and duodenum: Secondary | ICD-10-CM | POA: Diagnosis not present

## 2017-05-26 MED ORDER — SODIUM CHLORIDE 0.9 % IV SOLN: 500.0000 mL | INTRAVENOUS | Status: DC

## 2017-05-26 NOTE — Progress Notes (Signed)
Pt's states no medical or surgical changes since previsit or office visit. 

## 2017-05-26 NOTE — Op Note (Signed)
[facility] Patient Name: Alan Beck Procedure Date: 05/26/2017 8:05 AM MRN: [id number] Endoscopist: Beverley Fiedler , MD Age: 31 Referring MD:  Date of Birth: 1985/09/19 Gender: Male Account #: [id number] Procedure:                Upper GI endoscopy Indications:              Suspected gastro-esophageal reflux disease, Globus                            sensation Medicines:                Monitored Anesthesia Care Procedure:                Pre-Anesthesia Assessment:                           - Prior to the procedure, a History and Physical                            was performed, and patient medications and                            allergies were reviewed. The patient's tolerance of                            previous anesthesia was also reviewed. The risks                            and benefits of the procedure and the sedation                            options and risks were discussed with the patient.                            All questions were answered, and informed consent                            was obtained. Prior Anticoagulants: The patient has                            taken no previous anticoagulant or antiplatelet                            agents. ASA Grade Assessment: I - A normal, healthy                            patient. After reviewing the risks and benefits,                            the patient was deemed in satisfactory condition to                            undergo the procedure.  After obtaining informed consent, the endoscope was                            passed under direct vision. Throughout the                            procedure, the patient's blood pressure, pulse, and                            oxygen saturations were monitored continuously. The                            Model GIF-HQ190 [id number]) scope was introduced                            through the mouth, and advanced to the second part                        of duodenum. The upper GI endoscopy was                            accomplished without difficulty. The patient                            tolerated the procedure well. Scope In: Scope Out: Findings:                 In the proximal esophagus there was a small inlet                            patch without nodularity or visible abnormality.                           The examined esophagus was normal. Biopsies were                            obtained from the proximal and distal esophagus                            with cold forceps for histology of suspected                            eosinophilic esophagitis.                           The Z-line was regular and was found 40 cm from the                            incisors.                           The cardia and gastric fundus were normal on                            retroflexion.  A medium amount of food (residue) was found in the                            gastric body.                           Patchy mild inflammation characterized by erosions                            was found in the gastric body and in the gastric                            antrum. Biopsies were taken with a cold forceps for                            histology and Helicobacter pylori testing.                           The examined duodenum was normal. Complications:            No immediate complications. Estimated Blood Loss:     Estimated blood loss was minimal. Impression:               - Normal esophagus. Biopsied.                           - Z-line regular, 40 cm from the incisors. No                            evidence of hiatal hernia.                           - A medium amount of food (residue) in the stomach                            suggestive of gastroparesis.                           - Gastritis. Biopsied.                           - Normal examined duodenum. Recommendation:           - Patient has a  contact number available for                            emergencies. The signs and symptoms of potential                            delayed complications were discussed with the                            patient. Return to normal activities tomorrow.                            Written discharge instructions were provided to the  patient.                           - Resume previous diet.                           - Continue present medications.                           - Await pathology results. Beverley FiedlerJay M Rhet Rorke, MD 05/26/2017 8:31:30 AM This report has been signed electronically.

## 2017-05-26 NOTE — Progress Notes (Signed)
Report given to PACU, vss 

## 2017-05-26 NOTE — Progress Notes (Signed)
Called to room to assist during endoscopic procedure.  Patient ID and intended procedure confirmed with present staff. Received instructions for my participation in the procedure from the performing physician.  

## 2017-05-26 NOTE — Patient Instructions (Signed)
YOU HAD AN ENDOSCOPIC PROCEDURE TODAY AT THE [facility]:   Refer to the procedure report that was given to you for any specific questions about what was found during the examination.  If the procedure report does not answer your questions, please call your gastroenterologist to clarify.  If you requested that your care partner not be given the details of your procedure findings, then the procedure report has been included in a sealed envelope for you to review at your convenience later.  YOU SHOULD EXPECT: Some feelings of bloating in the abdomen. Passage of more gas than usual.  Walking can help get rid of the air that was put into your GI tract during the procedure and reduce the bloating. If you had a lower endoscopy (such as a colonoscopy or flexible sigmoidoscopy) you may notice spotting of blood in your stool or on the toilet paper. If you underwent a bowel prep for your procedure, you may not have a normal bowel movement for a few days.  Please Note:  You might notice some irritation and congestion in your nose or some drainage.  This is from the oxygen used during your procedure.  There is no need for concern and it should clear up in a day or so.  SYMPTOMS TO REPORT IMMEDIATELY:    Following upper endoscopy (EGD)  Vomiting of blood or coffee ground material  New chest pain or pain under the shoulder blades  Painful or persistently difficult swallowing  New shortness of breath  Fever of 100F or higher  Black, tarry-looking stools  For urgent or emergent issues, a gastroenterologist can be reached at any hour by calling [phone].   DIET:  We do recommend a small meal at first, but then you may proceed to your regular diet.  Drink plenty of fluids but you should avoid alcoholic beverages for 24 hours.  ACTIVITY:  You should plan to take it easy for the rest of today and you should NOT DRIVE or use heavy machinery until tomorrow (because of the sedation medicines used  during the test).    FOLLOW UP: Our staff will call the number listed on your records the next business day following your procedure to check on you and address any questions or concerns that you may have regarding the information given to you following your procedure. If we do not reach you, we will leave a message.  However, if you are feeling well and you are not experiencing any problems, there is no need to return our call.  We will assume that you have returned to your regular daily activities without incident.  If any biopsies were taken you will be contacted by phone or by letter within the next 1-3 weeks.  Please call us at [phone] if you have not heard about the biopsies in 3 weeks.    SIGNATURES/CONFIDENTIALITY: You and/or your care partner have signed paperwork which will be entered into your electronic medical record.  These signatures attest to the fact that that the information above on your After Visit Summary has been reviewed and is understood.  Full responsibility of the confidentiality of this discharge information lies with you and/or your care-partner.  Gastritis information given. 

## 2017-05-27 ENCOUNTER — Telehealth: Payer: Self-pay

## 2017-05-27 NOTE — Telephone Encounter (Signed)
Left message

## 2017-05-27 NOTE — Telephone Encounter (Signed)
  Follow up Call-  Call back number 05/26/2017  Post procedure Call Back phone  # [phone]  Permission to leave phone message Yes  Some recent data might be hidden     Patient questions:  Do you have a fever, pain , or abdominal swelling? No. Pain Score  0 *  Have you tolerated food without any problems? Yes.    Have you been able to return to your normal activities? Yes.    Do you have any questions about your discharge instructions: Diet   No. Medications  No. Follow up visit  No.  Do you have questions or concerns about your Care? No.  Actions: * If pain score is 4 or above: No action needed, pain <4.

## 2017-05-28 ENCOUNTER — Encounter: Payer: Self-pay | Admitting: Internal Medicine

## 2017-06-23 ENCOUNTER — Ambulatory Visit (INDEPENDENT_AMBULATORY_CARE_PROVIDER_SITE_OTHER): Payer: 59 | Admitting: Physician Assistant

## 2017-06-23 ENCOUNTER — Encounter: Payer: Self-pay | Admitting: Physician Assistant

## 2017-06-23 VITALS — BP 122/80 | HR 86 | Temp 97.7°F | Resp 16 | Ht 69.0 in | Wt 151.6 lb

## 2017-06-23 DIAGNOSIS — Z113 Encounter for screening for infections with a predominantly sexual mode of transmission: Secondary | ICD-10-CM

## 2017-06-23 DIAGNOSIS — J029 Acute pharyngitis, unspecified: Secondary | ICD-10-CM

## 2017-06-23 LAB — POCT RAPID STREP A (OFFICE): Rapid Strep A Screen: NEGATIVE

## 2017-06-23 MED ORDER — NYSTATIN 100000 UNIT/ML MT SUSP: OROMUCOSAL | 0 refills | Status: DC

## 2017-06-23 MED ORDER — AZITHROMYCIN 250 MG PO TABS: ORAL_TABLET | ORAL | 1 refills | Status: AC

## 2017-06-23 NOTE — Patient Instructions (Signed)
Continue prilosec 20mg , add on zantac or pepcid at night Do the mouth wash i'm giving you Do the zpak  If not better let me know

## 2017-06-23 NOTE — Progress Notes (Signed)
Subjective:    Patient ID: Alan Beck, male    DOB: 1986-03-14, [age] y.o.   MRN: [id number]  HPI 31 y.o. WM presents with sore throat. Had EGD 05/26/2017, has had sore throat since that time, and has had white coating on tongue and throat has been red. Feels throat tightness, feels like he has been "screaming", feels "lump" in his throat when he swallows, no fever, chills, sinus congestion. He is on prilosec.   Would like STD testing, no new partners, no symptoms.   Blood pressure 122/80, pulse 86, temperature 97.7 F (36.5 C), resp. rate 16, height 5\' 9"  (1.753 m), weight 151 lb 9.6 oz (68.8 kg), SpO2 98 %.  Medications Current Outpatient Medications on File Prior to Visit  Medication Sig  . Cholecalciferol (VITAMIN D PO) Take by mouth.  Marland Kitchen. omeprazole (PRILOSEC OTC) 20 MG tablet Take 20 mg by mouth daily.  . ranitidine (ZANTAC) 150 MG tablet Take 150 mg by mouth at bedtime.   No current facility-administered medications on file prior to visit.     Problem list He does not have a problem list on file.   Review of Systems  Constitutional: Negative for chills, fatigue and fever.  HENT: Positive for rhinorrhea, sore throat and trouble swallowing. Negative for congestion, drooling, ear discharge, ear pain, mouth sores, nosebleeds, postnasal drip and sinus pressure.   Eyes: Negative.   Respiratory: Negative for cough, chest tightness, shortness of breath and stridor.   Cardiovascular: Negative.   Gastrointestinal: Negative.  Negative for abdominal pain, diarrhea and vomiting.  Genitourinary: Negative.   Musculoskeletal: Positive for neck pain.  Neurological: Negative.  Negative for headaches.       Objective:   Physical Exam  Constitutional: He is oriented to person, place, and time. He appears well-developed and well-nourished.  HENT:  Head: Normocephalic and atraumatic.  Right Ear: Hearing and tympanic membrane normal.  Left Ear: Hearing and tympanic membrane normal.   Nose: Right sinus exhibits maxillary sinus tenderness. Left sinus exhibits maxillary sinus tenderness.  Mouth/Throat: Uvula is midline and mucous membranes are normal. Posterior oropharyngeal erythema present. No oropharyngeal exudate, posterior oropharyngeal edema or tonsillar abscesses.  Eyes: Conjunctivae are normal. Pupils are equal, round, and reactive to light.  Neck: Normal range of motion. Neck supple.  Cardiovascular: Normal rate and regular rhythm.  Pulmonary/Chest: Effort normal and breath sounds normal.  Abdominal: Soft. Bowel sounds are normal.  Musculoskeletal: Normal range of motion.  Lymphadenopathy:    He has no cervical adenopathy.  Neurological: He is alert and oriented to person, place, and time.  Skin: Skin is warm and dry. No rash noted.       Assessment & Plan:    Sore throat -     POCT rapid strep A- negative -     nystatin (MYCOSTATIN) 100000 UNIT/ML suspension; 5 ml four times a day, retain in mouth as long as possible (Swish and Spit).  Use for 48 hours after symptoms resolve. -     azithromycin (ZITHROMAX) 250 MG tablet; Take 2 tablets (500 mg) on  Day 1,  followed by 1 tablet (250 mg) once daily on Days 2 through 5.   Screen for STD (sexually transmitted disease) -     RPR -     C. trachomatis/N. gonorrhoeae RNA -     HIV antibody -     HSV(herpes simplex vrs) 1+2 ab-IgG  The patient was advised to call immediately if he has any concerning symptoms in the interval.  The patient voices understanding of current treatment options and is in agreement with the current care plan.The patient knows to call the clinic with any problems, questions or concerns or go to the ER if any further progression of symptoms.     Future Appointments  Date Time Provider Department Center  10/06/2017  3:00 PM Lucky CowboyMcKeown, William, MD [id number] None

## 2017-06-24 LAB — HSV(HERPES SIMPLEX VRS) I + II AB-IGG

## 2017-06-24 LAB — HIV ANTIBODY (ROUTINE TESTING W REFLEX): HIV: NONREACTIVE

## 2017-06-24 LAB — C. TRACHOMATIS/N. GONORRHOEAE RNA
C. trachomatis RNA, TMA: NOT DETECTED
N. gonorrhoeae RNA, TMA: NOT DETECTED

## 2017-06-24 LAB — RPR: RPR Ser Ql: NONREACTIVE

## 2017-10-06 ENCOUNTER — Ambulatory Visit (INDEPENDENT_AMBULATORY_CARE_PROVIDER_SITE_OTHER): Payer: 59 | Admitting: Internal Medicine

## 2017-10-06 ENCOUNTER — Encounter: Payer: Self-pay | Admitting: Internal Medicine

## 2017-10-06 VITALS — BP 118/68 | HR 76 | Temp 97.5°F | Resp 16 | Ht 69.5 in | Wt 154.8 lb

## 2017-10-06 DIAGNOSIS — Z0000 Encounter for general adult medical examination without abnormal findings: Secondary | ICD-10-CM

## 2017-10-06 DIAGNOSIS — Z131 Encounter for screening for diabetes mellitus: Secondary | ICD-10-CM

## 2017-10-06 DIAGNOSIS — Z136 Encounter for screening for cardiovascular disorders: Secondary | ICD-10-CM

## 2017-10-06 DIAGNOSIS — Z79899 Other long term (current) drug therapy: Secondary | ICD-10-CM | POA: Diagnosis not present

## 2017-10-06 DIAGNOSIS — E559 Vitamin D deficiency, unspecified: Secondary | ICD-10-CM

## 2017-10-06 DIAGNOSIS — R5383 Other fatigue: Secondary | ICD-10-CM

## 2017-10-06 DIAGNOSIS — Z111 Encounter for screening for respiratory tuberculosis: Secondary | ICD-10-CM

## 2017-10-06 DIAGNOSIS — Z1212 Encounter for screening for malignant neoplasm of rectum: Secondary | ICD-10-CM

## 2017-10-06 DIAGNOSIS — Z13220 Encounter for screening for lipoid disorders: Secondary | ICD-10-CM | POA: Insufficient documentation

## 2017-10-06 DIAGNOSIS — E782 Mixed hyperlipidemia: Secondary | ICD-10-CM | POA: Diagnosis not present

## 2017-10-06 DIAGNOSIS — Z1211 Encounter for screening for malignant neoplasm of colon: Secondary | ICD-10-CM

## 2017-10-06 DIAGNOSIS — Z0001 Encounter for general adult medical examination with abnormal findings: Secondary | ICD-10-CM

## 2017-10-06 NOTE — Progress Notes (Signed)
1 

## 2017-10-06 NOTE — Patient Instructions (Signed)

## 2017-10-06 NOTE — Progress Notes (Signed)
[facility]   Alan Beck, M.D.     Dyanne CarrelAmanda R. Steffanie Dunnollier, P.A.-C Judd GaudierAshley Corbett, DNP [facility]                [address]                [place], [place] [id number] Telephone [phone] Telefax [phone]) [phone] Annual  Screening/Preventative Visit  & Comprehensive Evaluation & Examination     This very nice 32 y.o. single WM presents for a Screening/Preventative Visit & comprehensive evaluation and management of multiple medical co-morbidities.  Patient has been followed expectantly for elevated BP, lipids, glucose and Vitamin D Deficiency. Patient does have GERD controlled on his Prilosec.       Patient had an EGD in Nov 2018 , By Dr Rhea BeltonPyrtle finding GERD.      Patient's BP has been controlled at home.  Today's BP is at goal - 118/68. Patient denies any cardiac symptoms as chest pain, palpitations, shortness of breath, dizziness or ankle swelling.     Patient's  lipids are controlled with diet. Last lipids were at goal albeit elevated Trig's: Lab Results  Component Value Date   CHOL 137 08/26/2016   HDL 39 (L) 08/26/2016   LDLCALC 57 08/26/2016   TRIG 205 (H) 08/26/2016   CHOLHDL 3.5 08/26/2016      Patient is screened expectantly for prediabetes and patient denies reactive hypoglycemic symptoms, visual blurring, diabetic polys or paresthesias. Last A1c was Normal & at goal:  Lab Results  Component Value Date   HGBA1C 4.9 08/26/2016       Finally, patient has history of Vitamin D Deficiency (25"/2009)  and last vitamin D was still not at goal (70-100): Lab Results  Component Value Date   VD25OH 17 (L) 08/26/2016   Current Outpatient Medications on File Prior to Visit  Medication Sig  . Cholecalciferol (VITAMIN D PO) Take by mouth. Takes occasionally  . omeprazole (PRILOSEC OTC) 20 MG tablet Take 20 mg by mouth daily.   No current facility-administered medications on file prior to visit.    Allergies   Allergen Reactions  . Cephalosporins Rash   Past Medical History:  Diagnosis Date  . Asthma   . Kidney stones   . Orchitis, left 2013   Dr Isabel CapriceGrapey  . Vitamin D deficiency    Health Maintenance  Topic Date Due  . INFLUENZA VACCINE  03/04/2017  . TETANUS/TDAP  08/26/2026  . HIV Screening  Completed   Immunization History  Administered Date(s) Administered  . PPD Test 08/26/2016  . Tdap 08/26/2016   Past Surgical History:  Procedure Laterality Date  . APPENDECTOMY  2007  . EYE SURGERY Left    Lens removed  . VITRECTOMY Left 03/2007   Rt Retinal Detachment & Rt CE/IOL  . WISDOM TOOTH EXTRACTION     Family History  Problem Relation Age of Onset  . GER disease Father   . Colon cancer Maternal Aunt   . Cancer Maternal Uncle        type unknown  . Hypertension Paternal Grandmother   . Breast cancer Paternal Grandmother   . Diabetes Paternal Grandmother   . Kidney disease Paternal Grandmother   . Hypertension Paternal Grandfather   . Cancer Paternal Grandfather        type unknown  . Cancer Cousin        type unknown  . Seizures Cousin   . Prostate cancer Maternal Grandfather  Social History   Socioeconomic History  . Marital status: Single  Social Needs  Occupational History  . Occupation: Transport planner for [employer]   Tobacco Use  . Smoking status: Never Smoker  . Smokeless tobacco: Never Used  Substance and Sexual Activity  . Alcohol use: Yes, rarely  . Drug use: No  . Sexual activity: Not on file    ROS Constitutional: Denies fever, chills, weight loss/gain, headaches, insomnia,  night sweats or change in appetite. Does c/o fatigue. Eyes: Denies redness, blurred vision, diplopia, discharge, itchy or watery eyes.  ENT: Denies discharge, congestion, post nasal drip, epistaxis, sore throat, earache, hearing loss, dental pain, Tinnitus, Vertigo, Sinus pain or snoring.  Cardio: Denies chest pain, palpitations, irregular heartbeat, syncope,  dyspnea, diaphoresis, orthopnea, PND, claudication or edema Respiratory: denies cough, dyspnea, DOE, pleurisy, hoarseness, laryngitis or wheezing.  Gastrointestinal: Denies dysphagia, heartburn, reflux, water brash, pain, cramps, nausea, vomiting, bloating, diarrhea, constipation, hematemesis, melena, hematochezia, jaundice or hemorrhoids Genitourinary: Denies dysuria, frequency, urgency, nocturia, hesitancy, discharge, hematuria or flank pain Musculoskeletal: Denies arthralgia, myalgia, stiffness, Jt. Swelling, pain, limp or strain/sprain. Denies Falls. Skin: Denies puritis, rash, hives, warts, acne, eczema or change in skin lesion Neuro: No weakness, tremor, incoordination, spasms, paresthesia or pain Psychiatric: Denies confusion, memory loss or sensory loss. Denies Depression. Endocrine: Denies change in weight, skin, hair change, nocturia, and paresthesia, diabetic polys, visual blurring or hyper / hypo glycemic episodes.  Heme/Lymph: No excessive bleeding, bruising or enlarged lymph nodes.  Physical Exam  BP 118/68   Pulse 76   Temp (!) 97.5 F (36.4 C)   Resp 16   Ht 5' 9.5" (1.765 m)   Wt 154 lb 12.8 oz (70.2 kg)   BMI 22.53 kg/m   General Appearance: Well nourished and well groomed and in no apparent distress.  Eyes: PERRLA, EOMs, conjunctiva no swelling or erythema, normal fundi and vessels. Sinuses: No frontal/maxillary tenderness ENT/Mouth: EACs patent / TMs  nl. Nares clear without erythema, swelling, mucoid exudates. Oral hygiene is good. No erythema, swelling, or exudate. Tongue normal, non-obstructing. Tonsils not swollen or erythematous. Hearing normal.  Neck: Supple, thyroid not palpable. No bruits, nodes or JVD. Respiratory: Respiratory effort normal.  BS equal and clear bilateral without rales, rhonci, wheezing or stridor. Cardio: Heart sounds are normal with regular rate and rhythm and no murmurs, rubs or gallops. Peripheral pulses are normal and equal bilaterally  without edema. No aortic or femoral bruits. Chest: symmetric with normal excursions and percussion.  Abdomen: Soft, with Nl bowel sounds. Nontender, no guarding, rebound, hernias, masses, or organomegaly.  Lymphatics: Non tender without lymphadenopathy.  Genitourinary: No hernias.Testes nl. DRE - prostate nl for age - smooth & firm w/o nodules. Musculoskeletal: Full ROM all peripheral extremities, joint stability, 5/5 strength, and normal gait. Skin: Warm and dry without rashes, lesions, cyanosis, clubbing or  ecchymosis.  Neuro: Cranial nerves intact, reflexes equal bilaterally. Normal muscle tone, no cerebellar symptoms. Sensation intact.  Pysch: Alert and oriented X 3 with normal affect, insight and judgment appropriate.   Assessment and Plan  1. Annual Preventative/Screening Exam   2. Hypertension screen  - CBC with Differential/Platelet - BASIC METABOLIC PANEL WITH GFR - Magnesium - TSH - Urinalysis, Routine w reflex microscopic  3. Hyperlipidemia, mixed  - Hepatic function panel - Lipid panel - TSH  4. Screening for diabetes mellitus  - Hemoglobin A1c - Insulin, random  5. Vitamin D deficiency  - VITAMIN D 25 Hydroxy   6. Screening for  colorectal cancer  - POC Hemoccult Bld/Stl   7. Screening examination for pulmonary tuberculosis   8. Fatigue  - TSH - Iron,Total/Total Iron Binding Cap - Vitamin B12 - Testosterone  9. Medication management  - CBC with Differential/Platelet - BASIC METABOLIC PANEL WITH GFR - Hepatic function panel - Magnesium - Lipid panel - TSH - Hemoglobin A1c - Insulin, random - VITAMIN D 25 Hydroxyl - Urinalysis, Routine w reflex microscopic        Patient was counseled in prudent diet, weight control to achieve/maintain BMI less than 25, BP monitoring, regular exercise and medications as discussed.  Discussed med effects and SE's. Routine screening labs and tests as requested with regular follow-up as recommended. Over 40  minutes of exam, counseling, chart review and high complex critical decision making was performed

## 2017-10-07 LAB — CBC WITH DIFFERENTIAL/PLATELET
Basophils Absolute: 59 {cells}/uL (ref 0–200)
Basophils Relative: 1.2 %
Eosinophils Absolute: 260 {cells}/uL (ref 15–500)
Eosinophils Relative: 5.3 %
HCT: 44.7 % (ref 38.5–50.0)
Hemoglobin: 15.6 g/dL (ref 13.2–17.1)
Lymphs Abs: 1612 {cells}/uL (ref 850–3900)
MCH: 30.3 pg (ref 27.0–33.0)
MCHC: 34.9 g/dL (ref 32.0–36.0)
MCV: 86.8 fL (ref 80.0–100.0)
MPV: 9.9 fL (ref 7.5–12.5)
Monocytes Relative: 7.8 %
Neutro Abs: 2587 {cells}/uL (ref 1500–7800)
Neutrophils Relative %: 52.8 %
Platelets: 285 Thousand/uL (ref 140–400)
RBC: 5.15 Million/uL (ref 4.20–5.80)
RDW: 11.9 % (ref 11.0–15.0)
Total Lymphocyte: 32.9 %
WBC mixed population: 382 {cells}/uL (ref 200–950)
WBC: 4.9 Thousand/uL (ref 3.8–10.8)

## 2017-10-07 LAB — URINALYSIS, ROUTINE W REFLEX MICROSCOPIC
Bilirubin Urine: NEGATIVE
GLUCOSE, UA: NEGATIVE
Hgb urine dipstick: NEGATIVE
KETONES UR: NEGATIVE
Leukocytes, UA: NEGATIVE
NITRITE: NEGATIVE
PH: 7.5 (ref 5.0–8.0)
Protein, ur: NEGATIVE
SPECIFIC GRAVITY, URINE: 1.019 (ref 1.001–1.03)

## 2017-10-07 LAB — LIPID PANEL
CHOL/HDL RATIO: 3.4 (calc) (ref <5.0)
Cholesterol: 141 mg/dL (ref <200)
HDL: 41 mg/dL (ref >40)
LDL Cholesterol (Calc): 80 mg/dL (calc)
NON-HDL CHOLESTEROL (CALC): 100 mg/dL (ref <130)
Triglycerides: 100 mg/dL (ref <150)

## 2017-10-07 LAB — TSH: TSH: 1.89 mIU/L (ref 0.40–4.50)

## 2017-10-07 LAB — HEMOGLOBIN A1C
HEMOGLOBIN A1C: 5 %{Hb} (ref <5.7)
MEAN PLASMA GLUCOSE: 97 (calc)
eAG (mmol/L): 5.4 (calc)

## 2017-10-07 LAB — HEPATIC FUNCTION PANEL
AG RATIO: 1.8 (calc) (ref 1.0–2.5)
ALKALINE PHOSPHATASE (APISO): 55 U/L (ref 40–115)
ALT: 9 U/L (ref 9–46)
AST: 12 U/L (ref 10–40)
Albumin: 4.6 g/dL (ref 3.6–5.1)
BILIRUBIN INDIRECT: 0.7 mg/dL (ref 0.2–1.2)
Bilirubin, Direct: 0.2 mg/dL (ref 0.0–0.2)
Globulin: 2.5 g/dL (calc) (ref 1.9–3.7)
TOTAL PROTEIN: 7.1 g/dL (ref 6.1–8.1)
Total Bilirubin: 0.9 mg/dL (ref 0.2–1.2)

## 2017-10-07 LAB — VITAMIN B12: Vitamin B-12: 418 pg/mL (ref 200–1100)

## 2017-10-07 LAB — TESTOSTERONE: Testosterone: 272 ng/dL (ref 250–827)

## 2017-10-07 LAB — BASIC METABOLIC PANEL WITHOUT GFR
BUN: 15 mg/dL (ref 7–25)
CO2: 27 mmol/L (ref 20–32)
Calcium: 9.4 mg/dL (ref 8.6–10.3)
Chloride: 104 mmol/L (ref 98–110)
Creat: 1 mg/dL (ref 0.60–1.35)
GFR, Est African American: 117 mL/min/1.73m2
GFR, Est Non African American: 101 mL/min/1.73m2
Glucose, Bld: 92 mg/dL (ref 65–99)
Potassium: 4 mmol/L (ref 3.5–5.3)
Sodium: 139 mmol/L (ref 135–146)

## 2017-10-07 LAB — MAGNESIUM: Magnesium: 2.2 mg/dL (ref 1.5–2.5)

## 2017-10-07 LAB — INSULIN, RANDOM: INSULIN: 5.4 u[IU]/mL (ref 2.0–19.6)

## 2017-10-07 LAB — IRON, TOTAL/TOTAL IRON BINDING CAP
%SAT: 36 % (calc) (ref 15–60)
Iron: 111 ug/dL (ref 50–180)
TIBC: 307 ug/dL (ref 250–425)

## 2017-10-07 LAB — VITAMIN D 25 HYDROXY (VIT D DEFICIENCY, FRACTURES): Vit D, 25-Hydroxy: 57 ng/mL (ref 30–100)

## 2017-10-12 DIAGNOSIS — H2701 Aphakia, right eye: Secondary | ICD-10-CM | POA: Diagnosis not present

## 2017-10-12 DIAGNOSIS — H43812 Vitreous degeneration, left eye: Secondary | ICD-10-CM | POA: Diagnosis not present

## 2017-10-12 DIAGNOSIS — H04123 Dry eye syndrome of bilateral lacrimal glands: Secondary | ICD-10-CM | POA: Diagnosis not present

## 2018-03-09 DIAGNOSIS — R0789 Other chest pain: Secondary | ICD-10-CM | POA: Diagnosis not present

## 2018-03-16 NOTE — Progress Notes (Signed)
Assessment and Plan:  Other fatigue -     CBC with Differential/Platelet -     COMPLETE METABOLIC PANEL WITH GFR -     TSH -     Testosterone - check labs, start on medication, if any new symptoms, symptoms persist with medication we may get further imaging, patient complains of vague tremor, weakness, normal nuero on exam.   Decreased pedal pulses -     VAS US ABI WITH/WO TBI; Future - will get imaging  Anxiety -     citalopram (CELEXA) 20 MG tablet; Take 1 tablet (20 mg total) by mouth daily. -     ALPRAZolam (XANAX) 0.5 MG tablet; Take 1 tablet (0.5 mg total) by mouth 3 (three) times daily as needed for sleep or anxiety. - start new medication prescribed, stress management techniques discussed, increase water, good sleep hygiene discussed, increase exercise, and increase veggies. Follow up 1 month, call the office if any new AE's from medications and we will switch them.    HPI 32 y.o.male Right handed.presents for anxiety and a multitude of symptoms, he has a history of GERD, vitD def, asthma. He had recent normal labs on 10/2017 at [facility], normal TSH, normal B12. Low normal testosterone, started on zinc. He got a dog in march, had a death in April with his grandfather, work has been increased stress. He states his cousin visited and he was smoking jeul and he does not smoke. He went to urgent care 03/09/2018 due to HA, numbness left arm and leg, SOB, and chest tightness.  Vitals were normal, EKG normal (unable to view), CXR is normal. He was given flexeril and atarax 25mg .   Dad has tremor and he feels that he has a tremor x years. He states at times he will have arm weakness specifically left arm from elbow to wrist AND some left leg numbness, lose his voice, and feel a lump in his throat. Pressure in head worse as the day goes on, states breathing has improved, still has tightness. No blurry vision, has had one ocular migraine.   Dizziness x 2 weeks, has been better x 3 days, has had  sinus congestion.    family history includes Breast cancer in his paternal grandmother; Cancer in his cousin, maternal uncle, and paternal grandfather; Colon cancer in his maternal aunt; Diabetes in his paternal grandmother; GER disease in his father; Hypertension in his paternal grandfather and paternal grandmother; Kidney disease in his paternal grandmother; Prostate cancer in his maternal grandfather; Seizures in his cousin. Mom had blood clot.    Past Medical History:  Diagnosis Date  . Asthma   . Kidney stones   . Orchitis, left 2013   Dr Isabel CapriceGrapey  . Vitamin D deficiency      Allergies  Allergen Reactions  . Cephalosporins Rash    Current Outpatient Medications on File Prior to Visit  Medication Sig  . Cholecalciferol (VITAMIN D PO) Take by mouth. Takes occasionally  . omeprazole (PRILOSEC OTC) 20 MG tablet Take 20 mg by mouth daily.  . Zinc 50 MG TABS Take by mouth.   No current facility-administered medications on file prior to visit.     ROS: all negative except above.   Physical Exam: Filed Weights   03/18/18 1054  Weight: 152 lb 6.4 oz (69.1 kg)   BP 114/80   Pulse 90   Temp 97.6 F (36.4 C)   Resp 16   Ht 5' 9.5" (1.765 m)   Wt 152  lb 6.4 oz (69.1 kg)   SpO2 97%   BMI 22.18 kg/m  General Appearance: Well nourished, in no apparent distress. Eyes: right eye with sluggish response and abnormal pupil from retinal detachment, EOMs, conjunctiva no swelling or erythema Sinuses: No Frontal/maxillary tenderness ENT/Mouth: Ext aud canals clear, TMs without erythema, bulging. No erythema, swelling, or exudate on post pharynx.  Tonsils not swollen or erythematous. Hearing normal.  Neck: Supple, thyroid normal.  Respiratory: Pectus excavatum. Respiratory effort normal, BS equal bilaterally without rales, rhonchi, wheezing or stridor.  Cardio: RRR with no MRGs. Brisk peripheral pulses right leg without edema, left leg decreased pulses.  Abdomen: Soft, + BS.  Non  tender, no guarding, rebound, hernias, masses. Lymphatics: Non tender without lymphadenopathy.  Musculoskeletal: Full ROM, 5/5 strength, normal gait, + hand and thumb test, wing span longer than height, double jointed/laxity. Skin: Warm, dry without rashes, lesions, ecchymosis.  Neuro: Cranial nerves intact. Normal muscle tone, no cerebellar symptoms. Sensation intact.No visible tremor.  Psych: Awake and oriented X 3, normal affect, Insight and Judgment appropriate.     Quentin MullingAmanda Vasil Juhasz, PA-C 12:07 PM [facility]

## 2018-03-18 ENCOUNTER — Ambulatory Visit (INDEPENDENT_AMBULATORY_CARE_PROVIDER_SITE_OTHER): Payer: 59 | Admitting: Physician Assistant

## 2018-03-18 ENCOUNTER — Encounter: Payer: Self-pay | Admitting: Physician Assistant

## 2018-03-18 VITALS — BP 114/80 | HR 90 | Temp 97.6°F | Resp 16 | Ht 69.5 in | Wt 152.4 lb

## 2018-03-18 DIAGNOSIS — F419 Anxiety disorder, unspecified: Secondary | ICD-10-CM | POA: Diagnosis not present

## 2018-03-18 DIAGNOSIS — R0989 Other specified symptoms and signs involving the circulatory and respiratory systems: Secondary | ICD-10-CM

## 2018-03-18 DIAGNOSIS — R5383 Other fatigue: Secondary | ICD-10-CM

## 2018-03-18 MED ORDER — CITALOPRAM HYDROBROMIDE 20 MG PO TABS: 20.0000 mg | ORAL_TABLET | Freq: Every day | ORAL | 2 refills | Status: DC

## 2018-03-18 MED ORDER — ALPRAZOLAM 0.5 MG PO TABS: 0.5000 mg | ORAL_TABLET | Freq: Three times a day (TID) | ORAL | 0 refills | Status: DC | PRN

## 2018-03-18 NOTE — Patient Instructions (Addendum)
Celexa start on 1/2 pill, this take 4 weeks to kick in Take the xanax as needed  We are starting you on a new medication. Here is some general information.   1) Medications are not always the solution, any medication we put you on there is always a hope to come off of it depending on the medication. For example, If we start you on a hypertension medication, I would love to get you off of it and we can address that every visit if you wish. I'm always willing to try to get you off a medication unless I really feel that it is beneficial for you.   2) With what I mentioned above, there is no magic pill, I need you to put in the work to get off any medication you wish to not be on. So things to help is move a little each day, drink plenty of water, eat veggies/fruit, and don't smoke.   3) Every medication has a potential for a side effect. Even over the counter medications have a potential side effect. So I start you on a medication and there is something different over the next 1-3 months let me know. It is always possible that it can be the medication.   Here is some information below about your new medication.  If you have any concerns or questions please contact the office and not Dr. Waverly Ferrari. =) Remember also that during a study ANY symptoms someone has can be listed as a side effect even if it was not caused by the medication.   Take omeprazole over the counter for 2 weeks, then go to zantac 150-300 mg OR pepcid 20 or 40mg  at night for 2 weeks, then you can stop or continue as needed.  Avoid alcohol, spicy foods, NSAIDS (aleve, ibuprofen) at this time. See foods below.   Food Choices for Gastroesophageal Reflux Disease When you have gastroesophageal reflux disease (GERD), the foods you eat and your eating habits are very important. Choosing the right foods can help ease the discomfort of GERD. WHAT GENERAL GUIDELINES DO I NEED TO FOLLOW?  Choose fruits, vegetables, whole grains, low-fat dairy  products, and low-fat meat, fish, and poultry.  Limit fats such as oils, salad dressings, butter, nuts, and avocado.  Keep a food diary to identify foods that cause symptoms.  Avoid foods that cause reflux. These may be different for different people.  Eat frequent small meals instead of three large meals each day.  Eat your meals slowly, in a relaxed setting.  Limit fried foods.  Cook foods using methods other than frying.  Avoid drinking alcohol.  Avoid drinking large amounts of liquids with your meals.  Avoid bending over or lying down until 2-3 hours after eating. WHAT FOODS ARE NOT RECOMMENDED? The following are some foods and drinks that may worsen your symptoms: Vegetables Tomatoes. Tomato juice. Tomato and spaghetti sauce. Chili peppers. Onion and garlic. Horseradish. Fruits Oranges, grapefruit, and lemon (fruit and juice). Meats High-fat meats, fish, and poultry. This includes hot dogs, ribs, ham, sausage, salami, and bacon. Dairy Whole milk and chocolate milk. Sour cream. Cream. Butter. Ice cream. Cream cheese.  Beverages Coffee and tea, with or without caffeine. Carbonated beverages or energy drinks. Condiments Hot sauce. Barbecue sauce.  Sweets/Desserts Chocolate and cocoa. Donuts. Peppermint and spearmint. Fats and Oils High-fat foods, including [place] fries and potato chips. Other Vinegar. Strong spices, such as black pepper, white pepper, red pepper, cayenne, curry powder, cloves, ginger, and chili powder.  Living With Anxiety After being diagnosed with an anxiety disorder, you may be relieved to know why you have felt or behaved a certain way. It is natural to also feel overwhelmed about the treatment ahead and what it will mean for your life. With care and support, you can manage this condition and recover from it. How to cope with anxiety Dealing with stress Stress is your body's reaction to life changes and events, both good and bad. Stress can last  just a few hours or it can be ongoing. Stress can play a major role in anxiety, so it is important to learn both how to cope with stress and how to think about it differently. Talk with your health care provider or a counselor to learn more about stress reduction. He or she may suggest some stress reduction techniques, such as:  Music therapy. This can include creating or listening to music that you enjoy and that inspires you.  Mindfulness-based meditation. This involves being aware of your normal breaths, rather than trying to control your breathing. It can be done while sitting or walking.  Centering prayer. This is a kind of meditation that involves focusing on a word, phrase, or sacred image that is meaningful to you and that brings you peace.  Deep breathing. To do this, expand your stomach and inhale slowly through your nose. Hold your breath for 3-5 seconds. Then exhale slowly, allowing your stomach muscles to relax.  Self-talk. This is a skill where you identify thought patterns that lead to anxiety reactions and correct those thoughts.  Muscle relaxation. This involves tensing muscles then relaxing them.  Choose a stress reduction technique that fits your lifestyle and personality. Stress reduction techniques take time and practice. Set aside 5-15 minutes a day to do them. Therapists can offer training in these techniques. The training may be covered by some insurance plans. Other things you can do to manage stress include:  Keeping a stress diary. This can help you learn what triggers your stress and ways to control your response.  Thinking about how you respond to certain situations. You may not be able to control everything, but you can control your reaction.  Making time for activities that help you relax, and not feeling guilty about spending your time in this way.  Therapy combined with coping and stress-reduction skills provides the best chance for successful  treatment. Medicines Medicines can help ease symptoms. Medicines for anxiety include:  Anti-anxiety drugs.  Antidepressants.  Beta-blockers.  Medicines may be used as the main treatment for anxiety disorder, along with therapy, or if other treatments are not working. Medicines should be prescribed by a health care provider. Relationships Relationships can play a big part in helping you recover. Try to spend more time connecting with trusted friends and family members. Consider going to couples counseling, taking family education classes, or going to family therapy. Therapy can help you and others better understand the condition. How to recognize changes in your condition Everyone has a different response to treatment for anxiety. Recovery from anxiety happens when symptoms decrease and stop interfering with your daily activities at home or work. This may mean that you will start to:  Have better concentration and focus.  Sleep better.  Be less irritable.  Have more energy.  Have improved memory.  It is important to recognize when your condition is getting worse. Contact your health care provider if your symptoms interfere with home or work and you do not feel like your  condition is improving. Where to find help and support: You can get help and support from these sources:  Self-help groups.  Online and [employer] organizations.  A trusted spiritual leader.  Couples counseling.  Family education classes.  Family therapy.  Follow these instructions at home:  Eat a healthy diet that includes plenty of vegetables, fruits, whole grains, low-fat dairy products, and lean protein. Do not eat a lot of foods that are high in solid fats, added sugars, or salt.  Exercise. Most adults should do the following: ? Exercise for at least 150 minutes each week. The exercise should increase your heart rate and make you sweat (moderate-intensity exercise). ? Strengthening exercises at least  twice a week.  Cut down on caffeine, tobacco, alcohol, and other potentially harmful substances.  Get the right amount and quality of sleep. Most adults need 7-9 hours of sleep each night.  Make choices that simplify your life.  Take over-the-counter and prescription medicines only as told by your health care provider.  Avoid caffeine, alcohol, and certain over-the-counter cold medicines. These may make you feel worse. Ask your pharmacist which medicines to avoid.  Keep all follow-up visits as told by your health care provider. This is important. Questions to ask your health care provider  Would I benefit from therapy?  How often should I follow up with a health care provider?  How long do I need to take medicine?  Are there any long-term side effects of my medicine?  Are there any alternatives to taking medicine? Contact a health care provider if:  You have a hard time staying focused or finishing daily tasks.  You spend many hours a day feeling worried about everyday life.  You become exhausted by worry.  You start to have headaches, feel tense, or have nausea.  You urinate more than normal.  You have diarrhea. Get help right away if:  You have a racing heart and shortness of breath.  You have thoughts of hurting yourself or others. If you ever feel like you may hurt yourself or others, or have thoughts about taking your own life, get help right away. You can go to your nearest emergency department or call:  Your local emergency services (911 in the [place]  A suicide crisis helpline, such as the National Suicide Prevention Lifeline at 856-404-49331-801-114-4782. This is open 24-hours a day.  Summary  Taking steps to deal with stress can help calm you.  Medicines cannot cure anxiety disorders, but they can help ease symptoms.  Family, friends, and partners can play a big part in helping you recover from an anxiety disorder. This information is not intended to replace  advice given to you by your health care provider. Make sure you discuss any questions you have with your health care provider. Document Released: 07/15/2016 Document Revised: 07/15/2016 Document Reviewed: 07/15/2016 Elsevier Interactive Patient Education  [employer]2018 [employer]  What is the TMJ? The temporomandibular (tem-PUH-ro-man-DIB-yoo-ler) joint, or the TMJ, connects the upper and lower jawbones. This joint allows the jaw to open wide and move back and forth when you chew, talk, or yawn.There are also several muscles that help this joint move. There can be muscle tightness and pain in the muscle that can cause several symptoms.  What causes TMJ pain? There are many causes of TMJ pain. Repeated chewing (for example, chewing gum) and clenching your teeth can cause pain in the joint. Some TMJ pain has no obvious cause. What can I do to ease the pain? There  are many things you can do to help your pain get better. When you have pain:  Eat soft foods and stay away from chewy foods (for example, taffy) Try to use both sides of your mouth to chew Don't chew gum Massage Don't open your mouth wide (for example, during yawning or singing) Don't bite your cheeks or fingernails Lower your amount of stress and worry Applying a warm, damp washcloth to the joint may help. Over-the-counter pain medicines such as ibuprofen (one brand: Advil) or acetaminophen (one brand: Tylenol) might also help. Do not use these medicines if you are allergic to them or if your doctor told you not to use them. How can I stop the pain from coming back? When your pain is better, you can do these exercises to make your muscles stronger and to keep the pain from coming back:  Resisted mouth opening: Place your thumb or two fingers under your chin and open your mouth slowly, pushing up lightly on your chin with your thumb. Hold for three to six seconds. Close your mouth slowly. Resisted mouth closing: Place your thumbs under your  chin and your two index fingers on the ridge between your mouth and the bottom of your chin. Push down lightly on your chin as you close your mouth. Tongue up: Slowly open and close your mouth while keeping the tongue touching the roof of the mouth. Side-to-side jaw movement: Place an object about one fourth of an inch thick (for example, two tongue depressors) between your front teeth. Slowly move your jaw from side to side. Increase the thickness of the object as the exercise becomes easier Forward jaw movement: Place an object about one fourth of an inch thick between your front teeth and move the bottom jaw forward so that the bottom teeth are in front of the top teeth. Increase the thickness of the object as the exercise becomes easier. These exercises should not be painful. If it hurts to do these exercises, stop doing them and talk to your family doctor.

## 2018-03-19 LAB — CBC WITH DIFFERENTIAL/PLATELET
BASOS ABS: 49 {cells}/uL (ref 0–200)
Basophils Relative: 0.9 %
Eosinophils Absolute: 313 cells/uL (ref 15–500)
Eosinophils Relative: 5.8 %
HEMATOCRIT: 43.8 % (ref 38.5–50.0)
Hemoglobin: 15.4 g/dL (ref 13.2–17.1)
LYMPHS ABS: 1561 {cells}/uL (ref 850–3900)
MCH: 30.2 pg (ref 27.0–33.0)
MCHC: 35.2 g/dL (ref 32.0–36.0)
MCV: 85.9 fL (ref 80.0–100.0)
MPV: 9.9 fL (ref 7.5–12.5)
Monocytes Relative: 7.1 %
NEUTROS PCT: 57.3 %
Neutro Abs: 3094 cells/uL (ref 1500–7800)
Platelets: 287 10*3/uL (ref 140–400)
RBC: 5.1 10*6/uL (ref 4.20–5.80)
RDW: 12.1 % (ref 11.0–15.0)
Total Lymphocyte: 28.9 %
WBC: 5.4 10*3/uL (ref 3.8–10.8)
WBCMIX: 383 {cells}/uL (ref 200–950)

## 2018-03-19 LAB — TESTOSTERONE: Testosterone: 288 ng/dL (ref 250–827)

## 2018-03-19 LAB — COMPLETE METABOLIC PANEL WITH GFR
AG RATIO: 1.7 (calc) (ref 1.0–2.5)
ALBUMIN MSPROF: 4.6 g/dL (ref 3.6–5.1)
ALKALINE PHOSPHATASE (APISO): 59 U/L (ref 40–115)
ALT: 10 U/L (ref 9–46)
AST: 13 U/L (ref 10–40)
BUN: 14 mg/dL (ref 7–25)
CO2: 27 mmol/L (ref 20–32)
Calcium: 9.5 mg/dL (ref 8.6–10.3)
Chloride: 105 mmol/L (ref 98–110)
Creat: 0.93 mg/dL (ref 0.60–1.35)
GFR, Est African American: 126 mL/min/{1.73_m2} (ref >=60)
GFR, Est Non African American: 109 mL/min/{1.73_m2} (ref >=60)
GLOBULIN: 2.7 g/dL (ref 1.9–3.7)
Glucose, Bld: 84 mg/dL (ref 65–99)
POTASSIUM: 4.4 mmol/L (ref 3.5–5.3)
SODIUM: 140 mmol/L (ref 135–146)
Total Bilirubin: 0.8 mg/dL (ref 0.2–1.2)
Total Protein: 7.3 g/dL (ref 6.1–8.1)

## 2018-03-19 LAB — TSH: TSH: 1.15 m[IU]/L (ref 0.40–4.50)

## 2018-04-06 ENCOUNTER — Ambulatory Visit (HOSPITAL_COMMUNITY)
Admission: RE | Admit: 2018-04-06 | Discharge: 2018-04-06 | Disposition: A | Discharge status: Home / Self Care | Payer: 59 | Source: Ambulatory Visit | Attending: Physician Assistant | Admitting: Physician Assistant

## 2018-04-06 DIAGNOSIS — R0989 Other specified symptoms and signs involving the circulatory and respiratory systems: Secondary | ICD-10-CM

## 2018-04-09 ENCOUNTER — Other Ambulatory Visit: Payer: Self-pay | Admitting: Physician Assistant

## 2018-04-09 DIAGNOSIS — R2991 Unspecified symptoms and signs involving the musculoskeletal system: Secondary | ICD-10-CM

## 2018-04-09 DIAGNOSIS — R0602 Shortness of breath: Secondary | ICD-10-CM

## 2018-04-15 ENCOUNTER — Ambulatory Visit (HOSPITAL_COMMUNITY): Payer: 59

## 2018-04-15 DIAGNOSIS — H43812 Vitreous degeneration, left eye: Secondary | ICD-10-CM | POA: Diagnosis not present

## 2018-04-15 DIAGNOSIS — H3321 Serous retinal detachment, right eye: Secondary | ICD-10-CM | POA: Diagnosis not present

## 2018-04-15 DIAGNOSIS — H2701 Aphakia, right eye: Secondary | ICD-10-CM | POA: Diagnosis not present

## 2018-04-19 ENCOUNTER — Other Ambulatory Visit: Payer: Self-pay

## 2018-04-19 ENCOUNTER — Ambulatory Visit (HOSPITAL_COMMUNITY): Payer: 59 | Attending: Cardiology

## 2018-04-19 DIAGNOSIS — R2991 Unspecified symptoms and signs involving the musculoskeletal system: Secondary | ICD-10-CM | POA: Diagnosis not present

## 2018-04-19 DIAGNOSIS — I361 Nonrheumatic tricuspid (valve) insufficiency: Secondary | ICD-10-CM | POA: Diagnosis not present

## 2018-04-19 DIAGNOSIS — R0602 Shortness of breath: Secondary | ICD-10-CM | POA: Insufficient documentation

## 2018-05-19 IMAGING — CR DG CHEST 2V
2 series · 2 of 2 positions shown · non-contrast
Comparison: None.

CLINICAL DATA: Nonsmoker, asthma

EXAM:
CHEST  2 VIEW

[chest lat]
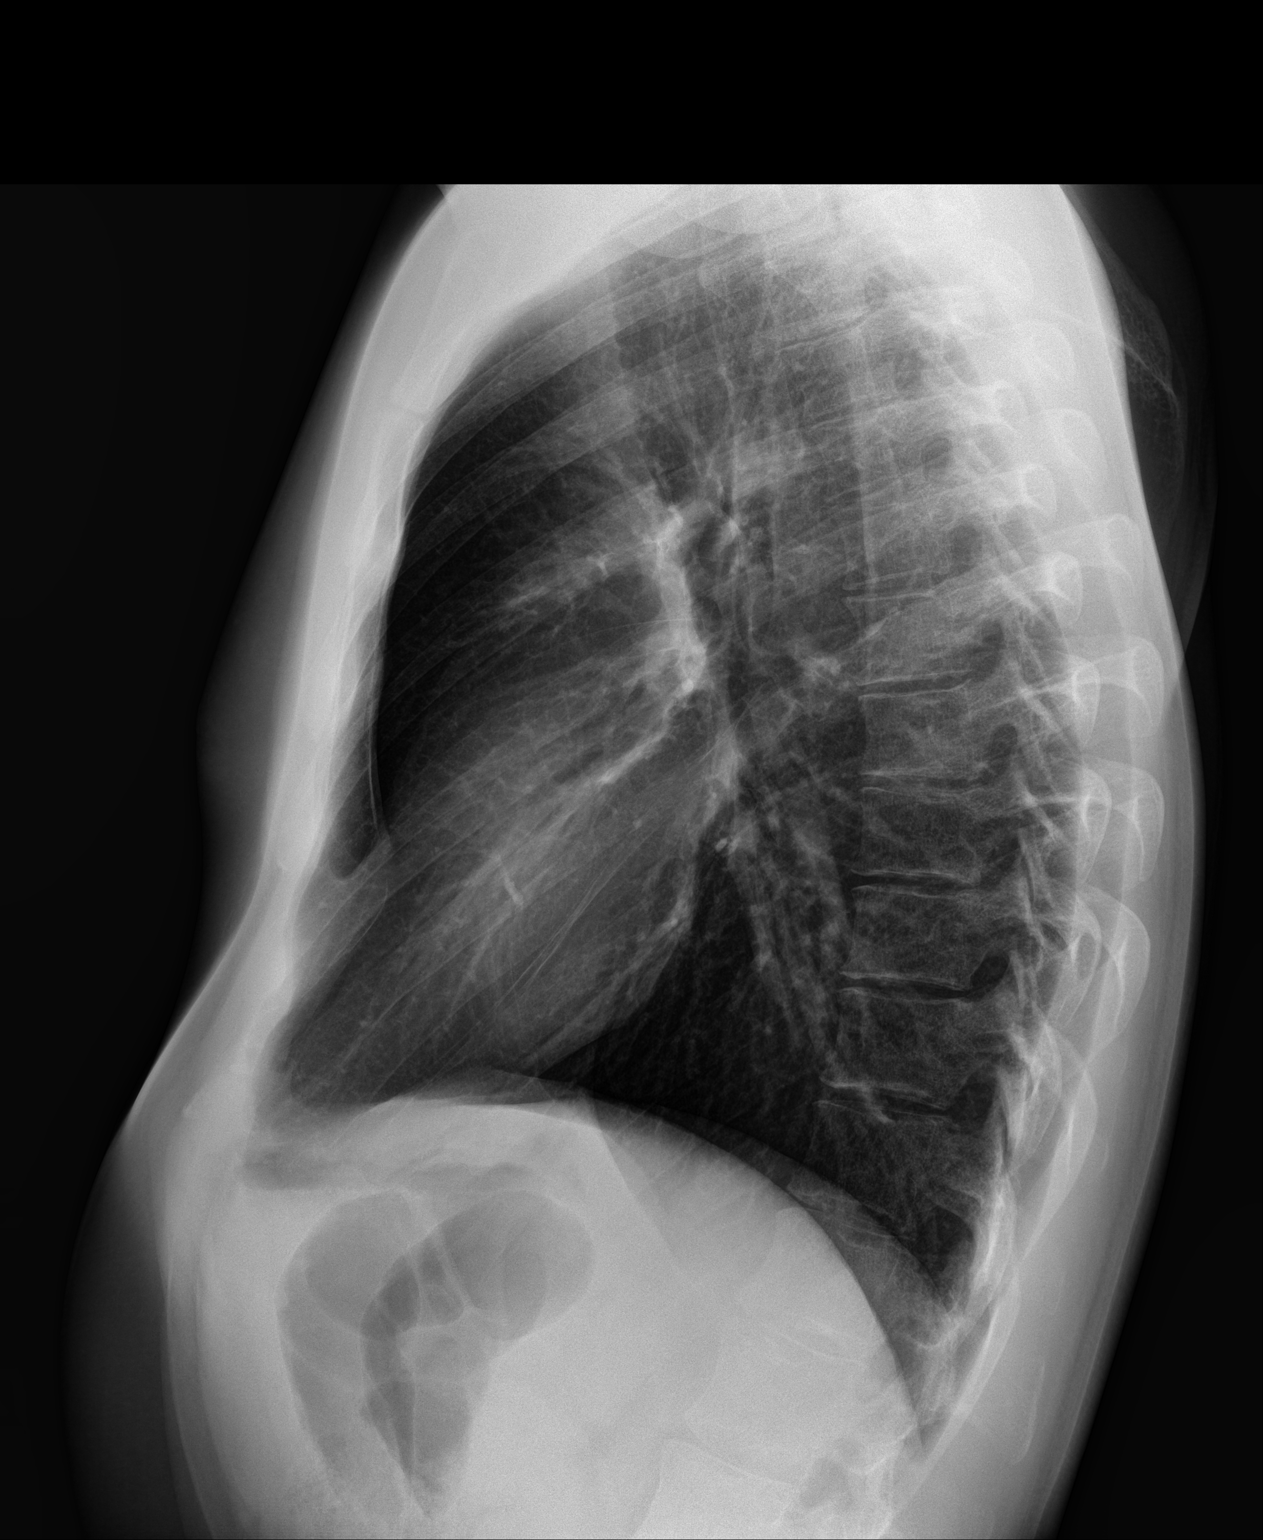

[chest ap]
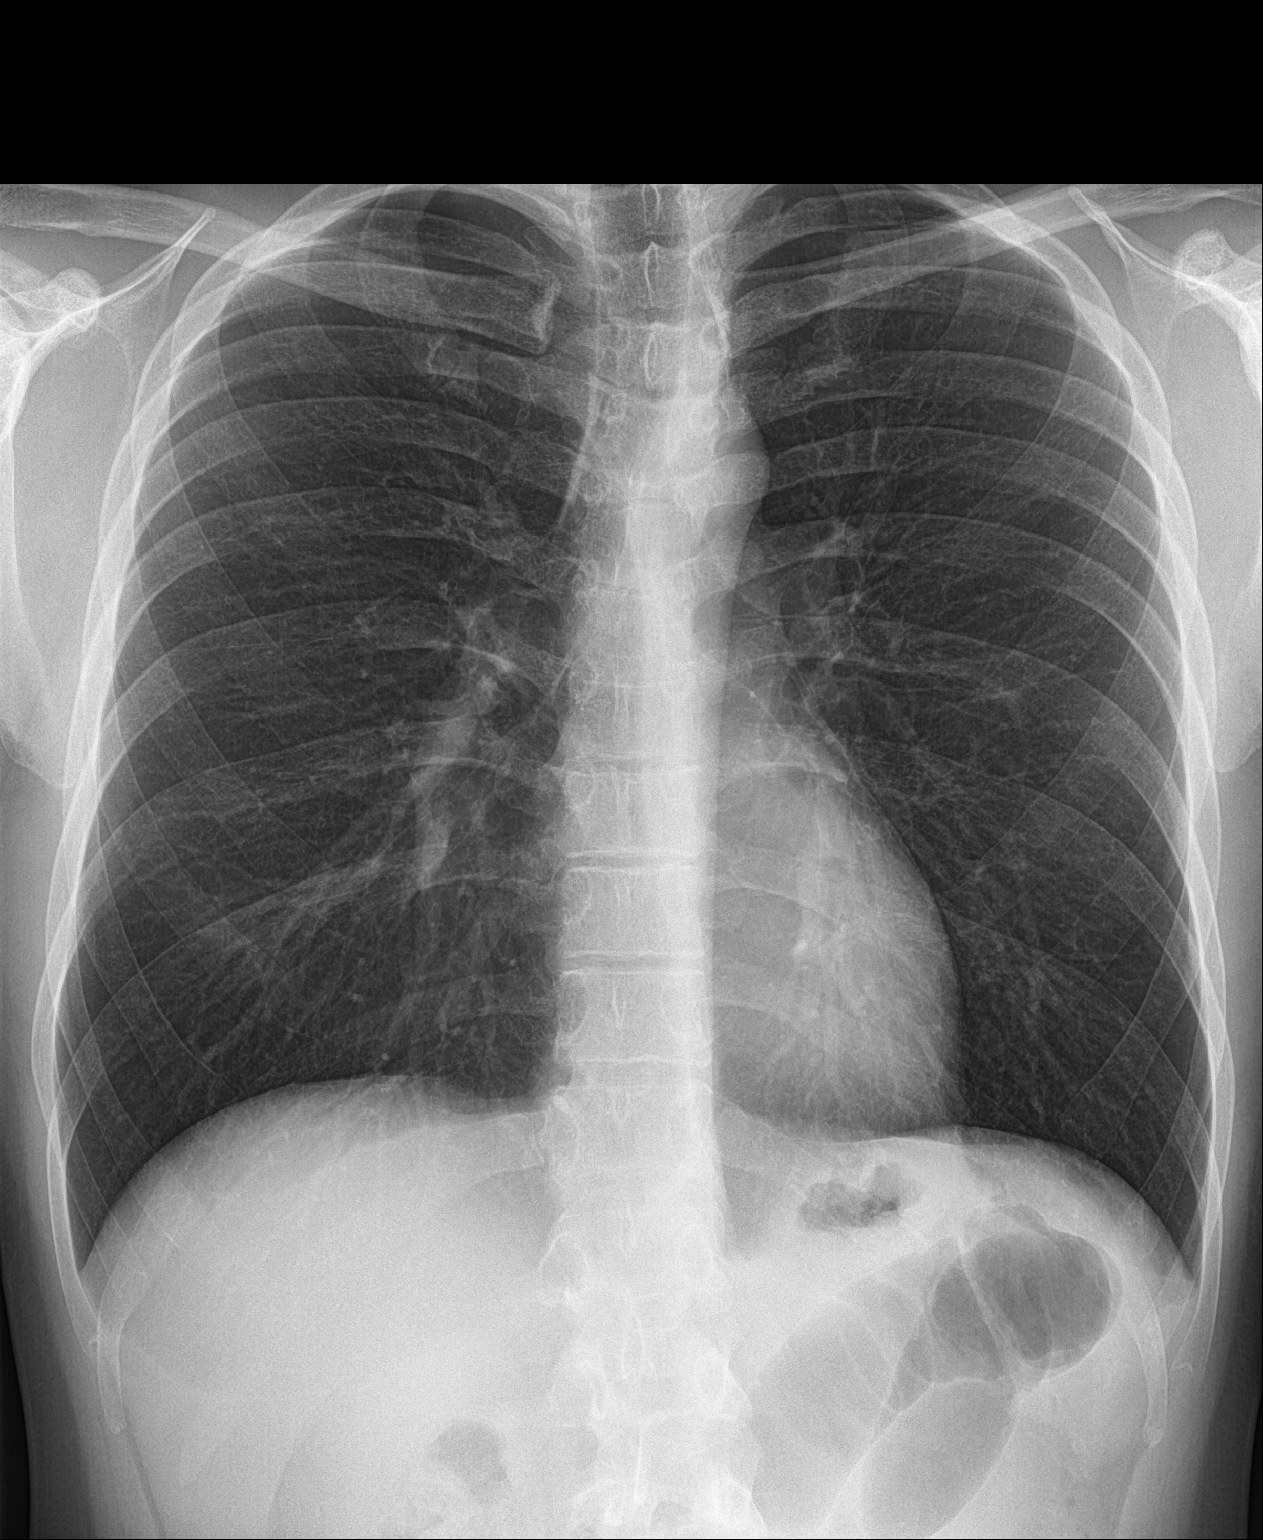

[2 of 2 positions shown; findings below may reference images not displayed]

FINDINGS: Lungs are clear.  No pleural effusion or pneumothorax.

The heart is normal in size.

Visualized osseous structures are within normal limits.
IMPRESSION: Normal chest radiographs.

## 2018-05-24 MED ORDER — CITALOPRAM HYDROBROMIDE 20 MG PO TABS: 20.0000 mg | ORAL_TABLET | Freq: Every day | ORAL | 1 refills | Status: DC

## 2018-08-26 ENCOUNTER — Ambulatory Visit (INDEPENDENT_AMBULATORY_CARE_PROVIDER_SITE_OTHER): Payer: 59 | Admitting: Adult Health

## 2018-08-26 ENCOUNTER — Encounter: Payer: Self-pay | Admitting: Adult Health

## 2018-08-26 VITALS — BP 100/64 | HR 83 | Temp 97.7°F | Ht 69.5 in | Wt 154.0 lb

## 2018-08-26 DIAGNOSIS — N390 Urinary tract infection, site not specified: Secondary | ICD-10-CM

## 2018-08-26 DIAGNOSIS — R300 Dysuria: Secondary | ICD-10-CM | POA: Diagnosis not present

## 2018-08-26 MED ORDER — SULFAMETHOXAZOLE-TRIMETHOPRIM 800-160 MG PO TABS: 1.0000 | ORAL_TABLET | Freq: Two times a day (BID) | ORAL | 0 refills | Status: DC

## 2018-08-26 NOTE — Progress Notes (Signed)
Assessment and Plan:  Alan Beck was seen today for acute visit.  Diagnoses and all orders for this visit:  Urinary tract infection without hematuria, site unspecified Suspect simple cystitis Maintain adequate hydration. Follow up if symptoms not improving, and as needed. Discussed if he has back pain, fever/chills, nausea, hematuria, etc to present to ED -     sulfamethoxazole-trimethoprim (BACTRIM DS,SEPTRA DS) 800-160 MG tablet; Take 1 tablet by mouth 2 (two) times daily. -     Urinalysis w microscopic + reflex cultur  Further disposition pending results of labs. Discussed med's effects and SE's.   Over 15 minutes of exam, counseling, chart review, and critical decision making was performed.   Future Appointments  Date Time Provider Department Center  11/15/2018  3:00 PM Lucky Cowboy, MD [id number] None    ------------------------------------------------------------------------------------------------------------------   HPI BP 100/64   Pulse 83   Temp 97.7 F (36.5 C)   Ht 5' 9.5" (1.765 m)   Wt 154 lb (69.9 kg)   SpO2 97%   BMI 22.42 kg/m   32 y.o.male with hx of single eipsode of kidney stone 9 years ago presents for evaluation of dysuria ongoing for 3-4 days; he does endorse cloudiness to urine yesterday, no other changes, no sediment, no gross hematuria. Denies back pain, abdominal pain, perineal pain, testicular tenderness, fever/chills.   He was screened for STIs a year ago, only new encounter since then was 1 week but oral sex only.   Past Medical History:  Diagnosis Date  . Asthma   . Kidney stones   . Orchitis, left 2013   Dr Isabel Caprice  . Vitamin D deficiency      Allergies  Allergen Reactions  . Cephalosporins Rash    Current Outpatient Medications on File Prior to Visit  Medication Sig  . ALPRAZolam (XANAX) 0.5 MG tablet Take 1 tablet (0.5 mg total) by mouth 3 (three) times daily as needed for sleep or anxiety.  . Cholecalciferol (VITAMIN D PO)  Take by mouth. Takes occasionally  . citalopram (CELEXA) 20 MG tablet Take 1 tablet (20 mg total) by mouth daily.  Marland Kitchen omeprazole (PRILOSEC OTC) 20 MG tablet Take 20 mg by mouth daily.  . Zinc 50 MG TABS Take by mouth.   No current facility-administered medications on file prior to visit.     ROS: all negative except above.   Physical Exam:  BP 100/64   Pulse 83   Temp 97.7 F (36.5 C)   Ht 5' 9.5" (1.765 m)   Wt 154 lb (69.9 kg)   SpO2 97%   BMI 22.42 kg/m   General Appearance: Well nourished, in no apparent distress. Eyes: conjunctiva no swelling or erythema ENT/Mouth: Hearing normal.  Neck: Supple Respiratory: Respiratory effort normal Cardio: RRR with no MRGs. Brisk peripheral pulses without edema.  Abdomen: Soft, + BS.  Non tender, bladder non-distended, some suprapubic pressure with tenderness Musculoskeletal: normal gait.  Skin: Warm, dry without rashes, lesions, ecchymosis.  Psych: Awake and oriented X 3, normal affect, Insight and Judgment appropriate.     Dan Maker, NP 11:47 AM [facility]

## 2018-08-26 NOTE — Patient Instructions (Addendum)
Call if your symptoms are getting worse call back   If you have fever/chills, back ache/pain, nausea/vomiting, headache, etc go to the ER - (possible pyelonephritis)   Urinary Tract Infection, Adult  A urinary tract infection (UTI) is an infection of any part of the urinary tract. The urinary tract includes the kidneys, ureters, bladder, and urethra. These organs make, store, and get rid of urine in the body. Your health care provider may use other names to describe the infection. An upper UTI affects the ureters and kidneys (pyelonephritis). A lower UTI affects the bladder (cystitis) and urethra (urethritis). What are the causes? Most urinary tract infections are caused by bacteria in your genital area, around the entrance to your urinary tract (urethra). These bacteria grow and cause inflammation of your urinary tract. What increases the risk? You are more likely to develop this condition if:  You have a urinary catheter that stays in place (indwelling).  You are not able to control when you urinate or have a bowel movement (you have incontinence).  You are male and you: ? Use a spermicide or diaphragm for birth control. ? Have low estrogen levels. ? Are pregnant.  You have certain genes that increase your risk (genetics).  You are sexually active.  You take antibiotic medicines.  You have a condition that causes your flow of urine to slow down, such as: ? An enlarged prostate, if you are male. ? Blockage in your urethra (stricture). ? A kidney stone. ? A nerve condition that affects your bladder control (neurogenic bladder). ? Not getting enough to drink, or not urinating often.  You have certain medical conditions, such as: ? Diabetes. ? A weak disease-fighting system (immunesystem). ? Sickle cell disease. ? Gout. ? Spinal cord injury. What are the signs or symptoms? Symptoms of this condition include:  Needing to urinate right away (urgently).  Frequent urination  or passing small amounts of urine frequently.  Pain or burning with urination.  Blood in the urine.  Urine that smells bad or unusual.  Trouble urinating.  Cloudy urine.  Vaginal discharge, if you are male.  Pain in the abdomen or the lower back. You may also have:  Vomiting or a decreased appetite.  Confusion.  Irritability or tiredness.  A fever.  Diarrhea. The first symptom in older adults may be confusion. In some cases, they may not have any symptoms until the infection has worsened. How is this diagnosed? This condition is diagnosed based on your medical history and a physical exam. You may also have other tests, including:  Urine tests.  Blood tests.  Tests for sexually transmitted infections (STIs). If you have had more than one UTI, a cystoscopy or imaging studies may be done to determine the cause of the infections. How is this treated? Treatment for this condition includes:  Antibiotic medicine.  Over-the-counter medicines to treat discomfort.  Drinking enough water to stay hydrated. If you have frequent infections or have other conditions such as a kidney stone, you may need to see a health care provider who specializes in the urinary tract (urologist). In rare cases, urinary tract infections can cause sepsis. Sepsis is a life-threatening condition that occurs when the body responds to an infection. Sepsis is treated in the hospital with IV antibiotics, fluids, and other medicines. Follow these instructions at home:  Medicines  Take over-the-counter and prescription medicines only as told by your health care provider.  If you were prescribed an antibiotic medicine, take it as told by  your health care provider. Do not stop using the antibiotic even if you start to feel better. General instructions  Make sure you: ? Empty your bladder often and completely. Do not hold urine for long periods of time. ? Empty your bladder after sex. ? Wipe from  front to back after a bowel movement if you are male. Use each tissue one time when you wipe.  Drink enough fluid to keep your urine pale yellow.  Keep all follow-up visits as told by your health care provider. This is important. Contact a health care provider if:  Your symptoms do not get better after 1-2 days.  Your symptoms go away and then return. Get help right away if you have:  Severe pain in your back or your lower abdomen.  A fever.  Nausea or vomiting. Summary  A urinary tract infection (UTI) is an infection of any part of the urinary tract, which includes the kidneys, ureters, bladder, and urethra.  Most urinary tract infections are caused by bacteria in your genital area, around the entrance to your urinary tract (urethra).  Treatment for this condition often includes antibiotic medicines.  If you were prescribed an antibiotic medicine, take it as told by your health care provider. Do not stop using the antibiotic even if you start to feel better.  Keep all follow-up visits as told by your health care provider. This is important. This information is not intended to replace advice given to you by your health care provider. Make sure you discuss any questions you have with your health care provider. Document Released: 04/30/2005 Document Revised: 01/28/2018 Document Reviewed: 01/28/2018 Elsevier Interactive Patient Education  2019 Else[employer] Inc[employer]    Sulfamethoxazole; Trimethoprim, SMX-TMP tablets What is this medicine? SULFAMETHOXAZOLE; TRIMETHOPRIM or SMX-TMP (suhl fuh meth OK suh zohl; trye METH oh prim) is a combination of a sulfonamide antibiotic and a second antibiotic, trimethoprim. It is used to treat or prevent certain kinds of bacterial infections. It will not work for colds, flu, or other viral infections. This medicine may be used for other purposes; ask your health care provider or pharmacist if you have questions. COMMON BRAND NAME(S): Bacter-Aid DS,  Bactrim, Bactrim DS, Septra, Septra DS What should I tell my health care provider before I take this medicine? They need to know if you have any of these conditions: -anemia -asthma -being treated with anticonvulsants -if you frequently drink alcohol containing drinks -kidney disease -liver disease -low level of folic acid or [id number]-VZDGLOVFI dehydrogenase -poor nutrition or malabsorption -porphyria -severe allergies -thyroid disorder -an unusual or allergic reaction to sulfamethoxazole, trimethoprim, sulfa drugs, other medicines, foods, dyes, or preservatives -pregnant or trying to get pregnant -breast-feeding How should I use this medicine? Take this medicine by mouth with a full glass of water. Follow the directions on the prescription label. Take your medicine at regular intervals. Do not take it more often than directed. Do not skip doses or stop your medicine early. Talk to your pediatrician regarding the use of this medicine in children. Special care may be needed. This medicine has been used in children as young as 16 months of age. Overdosage: If you think you have taken too much of this medicine contact a poison control center or emergency room at once. NOTE: This medicine is only for you. Do not share this medicine with others. What if I miss a dose? If you miss a dose, take it as soon as you can. If it is almost time for your next dose,  take only that dose. Do not take double or extra doses. What may interact with this medicine? Do not take this medicine with any of the following medications: -aminobenzoate potassium -dofetilide -metronidazole This medicine may also interact with the following medications: -ACE inhibitors like benazepril, enalapril, lisinopril, and ramipril -birth control pills -cyclosporine -digoxin -diuretics -indomethacin -medicines for diabetes -methenamine -methotrexate -phenytoin -potassium  supplements -pyrimethamine -sulfinpyrazone -tricyclic antidepressants -warfarin This list may not describe all possible interactions. Give your health care provider a list of all the medicines, herbs, non-prescription drugs, or dietary supplements you use. Also tell them if you smoke, drink alcohol, or use illegal drugs. Some items may interact with your medicine. What should I watch for while using this medicine? Tell your doctor or health care professional if your symptoms do not improve. Drink several glasses of water a day to reduce the risk of kidney problems. Do not treat diarrhea with over the counter products. Contact your doctor if you have diarrhea that lasts more than 2 days or if it is severe and watery. This medicine can make you more sensitive to the sun. Keep out of the sun. If you cannot avoid being in the sun, wear protective clothing and use a sunscreen. Do not use sun lamps or tanning beds/booths. What side effects may I notice from receiving this medicine? Side effects that you should report to your doctor or health care professional as soon as possible: -allergic reactions like skin rash or hives, swelling of the face, lips, or tongue -breathing problems -fever or chills, sore throat -irregular heartbeat, chest pain -joint or muscle pain -pain or difficulty passing urine -red pinpoint spots on skin -redness, blistering, peeling or loosening of the skin, including inside the mouth -unusual bleeding or bruising -unusually weak or tired -yellowing of the eyes or skin Side effects that usually do not require medical attention (report to your doctor or health care professional if they continue or are bothersome): -diarrhea -dizziness -headache -loss of appetite -nausea, vomiting -nervousness This list may not describe all possible side effects. Call your doctor for medical advice about side effects. You may report side effects to FDA at 1-800-FDA-1088. Where should I  keep my medicine? Keep out of the reach of children. Store at room temperature between 20 to 25 degrees C (68 to 77 degrees F). Protect from light. Throw away any unused medicine after the expiration date. NOTE: This sheet is a summary. It may not cover all possible information. If you have questions about this medicine, talk to your doctor, pharmacist, or health care provider.  2019 Elsevier/Gold Standard (2013-02-25 14:38:26)

## 2018-08-27 LAB — URINALYSIS W MICROSCOPIC + REFLEX CULTURE
BACTERIA UA: NONE SEEN /HPF
Bilirubin Urine: NEGATIVE
Glucose, UA: NEGATIVE
Hgb urine dipstick: NEGATIVE
Hyaline Cast: NONE SEEN /LPF
Ketones, ur: NEGATIVE
LEUKOCYTE ESTERASE: NEGATIVE
Nitrites, Initial: NEGATIVE
Protein, ur: NEGATIVE
SQUAMOUS EPITHELIAL / LPF: NONE SEEN /HPF (ref <=5)
Specific Gravity, Urine: 1.02 (ref 1.001–1.03)
WBC, UA: NONE SEEN /HPF (ref 0–5)
pH: 7 (ref 5.0–8.0)

## 2018-08-27 LAB — NO CULTURE INDICATED

## 2018-09-06 ENCOUNTER — Other Ambulatory Visit: Payer: Self-pay | Admitting: Adult Health

## 2018-09-06 ENCOUNTER — Ambulatory Visit (INDEPENDENT_AMBULATORY_CARE_PROVIDER_SITE_OTHER): Payer: 59

## 2018-09-06 VITALS — Wt 152.4 lb

## 2018-09-06 DIAGNOSIS — Z113 Encounter for screening for infections with a predominantly sexual mode of transmission: Secondary | ICD-10-CM

## 2018-09-06 DIAGNOSIS — N50819 Testicular pain, unspecified: Secondary | ICD-10-CM

## 2018-09-06 MED ORDER — AZITHROMYCIN 250 MG PO TABS: ORAL_TABLET | ORAL | 0 refills | Status: DC

## 2018-09-06 MED ORDER — DOXYCYCLINE HYCLATE 100 MG PO CAPS: ORAL_CAPSULE | ORAL | 0 refills | Status: DC

## 2018-09-06 NOTE — Progress Notes (Signed)
Pt reports he for lab results  Which were entered into epic. Rocephin injection was not given at the time of visit per provider.

## 2018-09-07 LAB — RPR: RPR Ser Ql: NONREACTIVE

## 2018-09-07 LAB — HIV ANTIBODY (ROUTINE TESTING W REFLEX): HIV: NONREACTIVE

## 2018-11-14 ENCOUNTER — Encounter: Payer: Self-pay | Admitting: Internal Medicine

## 2018-11-14 NOTE — Progress Notes (Signed)
[facility]   Alan Beck, M.D.     Dyanne CarrelAmanda R. Steffanie Dunnollier, P.A.-C Judd GaudierAshley Corbett, DNP [facility]                [address]                [place], [place] [id number] Telephone [phone] Telefax [phone] Annual  Screening/Preventative Visit  & Comprehensive Evaluation & Examination  History of Present Illness:     This very nice 33 y.o. single WM presents for a Screening /Preventative Visit & comprehensive evaluation and management of multiple medical co-morbidities.  Patient has been followed expectantly for elevated BP, lipids, glucose intolerance  and Vitamin D Deficiency. Patient has GERD controlled with occasional OTC Omeprazole. Patient has hx/o Anxiety controlled on his Celexa.      Patient's BP has been controlled at home.  Today's BP: 92/60 and rechecked at 96/73. Patient denies any cardiac symptoms as postural lightheadedness, chest pain, palpitations, shortness of breath, dizziness or ankle swelling.     Patient's hyperlipidemia is controlled with diet and medications. Patient denies myalgias or other medication SE's. Last lipids were at goal:  Lab Results  Component Value Date   CHOL 141 10/06/2017   HDL 41 10/06/2017   LDLCALC 80 10/06/2017   TRIG 100 10/06/2017   CHOLHDL 3.4 10/06/2017      Patient is monitored proactively for Glucose Intolerance and patient denies reactive hypoglycemic symptoms, visual blurring, diabetic polys or paresthesias. Last A1c was Normal and at goal: Lab Results  Component Value Date   HGBA1C 5.0 10/06/2017       Finally, patient has history of Vitamin D Deficiency ("25" / 2009) and last vitamin D was at goal: Lab Results  Component Value Date   VD25OH 57 10/06/2017   Current Outpatient Medications on File Prior to Visit  Medication Sig  . Cholecalciferol (VITAMIN D PO) Take by mouth. Takes occasionally  . citalopram (CELEXA) 20 MG tablet Take 1 tablet (20 mg  total) by mouth daily.  Marland Kitchen. omeprazole (PRILOSEC OTC) 20 MG tablet Take 20 mg by mouth daily.  . Zinc 50 MG TABS Take by mouth.   No current facility-administered medications on file prior to visit.    Allergies  Allergen Reactions  . Cephalosporins Rash   Past Medical History:  Diagnosis Date  . Asthma   . Kidney stones   . Orchitis, left 2013   Dr Isabel CapriceGrapey  . Vitamin D deficiency    Health Maintenance  Topic Date Due  . INFLUENZA VACCINE  03/05/2019  . TETANUS/TDAP  08/26/2026  . HIV Screening  Completed   Immunization History  Administered Date(s) Administered  . PPD Test 08/26/2016, 11/15/2018  . Tdap 08/26/2016   Past Surgical History:  Procedure Laterality Date  . APPENDECTOMY  2007  . EYE SURGERY Left    Lens removed  . VITRECTOMY Left 03/2007   Rt Retinal Detachment & Rt CE/IOL  . WISDOM TOOTH EXTRACTION     Family History  Problem Relation Age of Onset  . GER disease Father   . Colon cancer Maternal Aunt   . Cancer Maternal Uncle        type unknown  . Hypertension Paternal Grandmother   . Breast cancer Paternal Grandmother   . Diabetes Paternal Grandmother   . Kidney disease Paternal Grandmother   . Hypertension Paternal Grandfather   . Cancer Paternal Grandfather        type  unknown  . Cancer Cousin        type unknown  . Seizures Cousin   . Prostate cancer Maternal Grandfather    Social History   Socioeconomic History  . Marital status: Single  . Years of education: Not on file  . Highest education level: Not on file  Occupational History  . Occupation: account analyst  Tobacco Use  . Smoking status: Never Smoker  . Smokeless tobacco: Never Used  Substance and Sexual Activity  . Alcohol use: Yes    Comment: rarely  . Drug use: No  . Sexual activity: Not on file    ROS Constitutional: Denies fever, chills, weight loss/gain, headaches, insomnia,  night sweats or change in appetite. Does c/o fatigue. Eyes: Denies redness, blurred vision,  diplopia, discharge, itchy or watery eyes.  ENT: Denies discharge, congestion, post nasal drip, epistaxis, sore throat, earache, hearing loss, dental pain, Tinnitus, Vertigo, Sinus pain or snoring.  Cardio: Denies chest pain, palpitations, irregular heartbeat, syncope, dyspnea, diaphoresis, orthopnea, PND, claudication or edema Respiratory: denies cough, dyspnea, DOE, pleurisy, hoarseness, laryngitis or wheezing.  Gastrointestinal: Denies dysphagia, heartburn, reflux, water brash, pain, cramps, nausea, vomiting, bloating, diarrhea, constipation, hematemesis, melena, hematochezia, jaundice or hemorrhoids Genitourinary: Denies dysuria, frequency, urgency, nocturia, hesitancy, discharge, hematuria or flank pain Musculoskeletal: Denies arthralgia, myalgia, stiffness, Jt. Swelling, pain, limp or strain/sprain. Denies Falls. Skin: Denies puritis, rash, hives, warts, acne, eczema or change in skin lesion Neuro: No weakness, tremor, incoordination, spasms, paresthesia or pain Psychiatric: Denies confusion, memory loss or sensory loss. Denies Depression. Endocrine: Denies change in weight, skin, hair change, nocturia, and paresthesia, diabetic polys, visual blurring or hyper / hypo glycemic episodes.  Heme/Lymph: No excessive bleeding, bruising or enlarged lymph nodes.  Physical Exam  BP 92/60   Pulse 72   Temp (!) 97 F (36.1 C)   Resp 16   Ht 5' 9.5" (1.765 m)   Wt 152 lb 6.4 oz (69.1 kg)   BMI 22.18 kg/m   General Appearance: Well nourished and well groomed and in no apparent distress.  Eyes: PERRLA, EOMs, conjunctiva no swelling or erythema, normal fundi and vessels. Sinuses: No frontal/maxillary tenderness ENT/Mouth: EACs patent / TMs  nl. Nares clear without erythema, swelling, mucoid exudates. Oral hygiene is good. No erythema, swelling, or exudate. Tongue normal, non-obstructing. Tonsils not swollen or erythematous. Hearing normal.  Neck: Supple, thyroid not palpable. No bruits, nodes or  JVD. Respiratory: Respiratory effort normal.  BS equal and clear bilateral without rales, rhonci, wheezing or stridor. Cardio: Heart sounds are normal with regular rate and rhythm and no murmurs, rubs or gallops. Peripheral pulses are normal and equal bilaterally without edema. No aortic or femoral bruits. Chest: symmetric with normal excursions and percussion.  Abdomen: Soft, with Nl bowel sounds. Nontender, no guarding, rebound, hernias, masses, or organomegaly.  Lymphatics: Non tender without lymphadenopathy.  Musculoskeletal: Full ROM all peripheral extremities, joint stability, 5/5 strength, and normal gait. Skin: Warm and dry without rashes, lesions, cyanosis, clubbing or  ecchymosis.  Neuro: Cranial nerves intact, reflexes equal bilaterally. Normal muscle tone, no cerebellar symptoms. Sensation intact.  Pysch: Alert and oriented x 3 with normal affect, insight and judgment appropriate.   Assessment and Plan  1. Annual Preventative/Screening Exam   1. Encounter for general adult medical examination with abnormal findings   2. Elevated BP without diagnosis of hypertension  - Urinalysis, Routine w reflex microscopic - COMPLETE METABOLIC PANEL WITH GFR - Magnesium - TSH  3. Screening cholesterol level  -  Lipid panel - TSH  4. Abnormal glucose  - Hemoglobin A1c - Insulin, random  5. Vitamin D deficiency  - VITAMIN D 25 Hydroxy (Vit-D Deficiency, Fractures)  6. Anxiety   7. Screening for colorectal cancer  - POC Hemoccult Bld/Stl (3-Cd Home Screen); Future  8. Screening examination for pulmonary tuberculosis  - TB Skin Test  9. Gastroesophageal reflux disease with esophagitis  - CBC with Differential/Platelet  10. Fatigue, unspecified type  - Iron,Total/Total Iron Binding Cap - Vitamin B12 - Testosterone - TSH  11. Medication management  - Urinalysis, Routine w reflex microscopic - CBC with Differential/Platelet - COMPLETE METABOLIC PANEL WITH GFR -  Magnesium - Lipid panel - TSH - Hemoglobin A1c - Insulin, random - VITAMIN D 25 Hydroxyl         Patient was counseled in prudent diet, weight control to achieve/maintain BMI less than 25, BP monitoring, regular exercise and medications as discussed.  Discussed med effects and SE's. Routine screening labs and tests as requested with regular follow-up as recommended. I discussed the assessment and treatment plan as above with the patient. The patient was provided an opportunity to ask questions and all were answered. The patient agreed with the plan and demonstrated an understanding of the instructions. Over 40 minutes of exam, counseling, chart review and high complex critical decision making was performed       I provided 25 minutes of non-face-to-face time during this encounter and over 40 minutes of exam, counseling, chart review and  complex critical decision making was performed  Marinus Maw, MD

## 2018-11-14 NOTE — Patient Instructions (Signed)
Coronavirus (COVID-19) Are you at risk?  Are you at risk for the Coronavirus (COVID-19)?  To be considered HIGH RISK for Coronavirus (COVID-19), you have to meet the following criteria:  . Traveled to [place], [place], [place], [place] or [place]; or in the [place] to [place], [place], [place]  . or [place]; and have fever, cough, and shortness of breath within the last 2 weeks of travel OR . Been in close contact with a person diagnosed with COVID-19 within the last 2 weeks and have  . fever, cough,and shortness of breath .  . IF YOU DO NOT MEET THESE CRITERIA, YOU ARE CONSIDERED LOW RISK FOR COVID-19.  What to do if you are HIGH RISK for COVID-19?  Marland Kitchen If you are having a medical emergency, call 911. . Seek medical care right away. Before you go to a doctor's office, urgent care or emergency department, .  call ahead and tell them about your recent travel, contact with someone diagnosed with COVID-19  .  and your symptoms.  . You should receive instructions from your physician's office regarding next steps of care.  . When you arrive at healthcare provider, tell the healthcare staff immediately you have returned from  . visiting [place], [place], [place], [place] or [place]; or traveled in the [place] to [place], [place],  . [place] or [place] in the last two weeks or you have been in close contact with a person diagnosed with  . COVID-19 in the last 2 weeks.   . Tell the health care staff about your symptoms: fever, cough and shortness of breath. . After you have been seen by a medical provider, you will be either: o Tested for (COVID-19) and discharged home on quarantine except to seek medical care if  o symptoms worsen, and asked to  - Stay home and avoid contact with others until you get your results (4-5 days)  - Avoid travel on public transportation if possible (such as bus, train, or airplane) or o Sent to the Emergency Department by EMS for evaluation,  COVID-19 testing  and  o possible admission depending on your condition and test results.  What to do if you are LOW RISK for COVID-19?  Reduce your risk of any infection by using the same precautions used for avoiding the common cold or flu:  Marland Kitchen Wash your hands often with soap and warm water for at least 20 seconds.  If soap and water are not readily available,  . use an alcohol-based hand sanitizer with at least 60% alcohol.  . If coughing or sneezing, cover your mouth and nose by coughing or sneezing into the elbow areas of your shirt or coat, .  into a tissue or into your sleeve (not your hands). . Avoid shaking hands with others and consider head nods or verbal greetings only. . Avoid touching your eyes, nose, or mouth with unwashed hands.  . Avoid close contact with people who are sick. . Avoid places or events with large numbers of people in one location, like concerts or sporting events. . Carefully consider travel plans you have or are making. . If you are planning any travel outside or inside the [place], visit the CDC's Travelers' Health webpage for the latest health notices. . If you have some symptoms but not all symptoms, continue to monitor at home and seek medical attention  . if your symptoms worsen. . If you are having a medical emergency, call 911. >>>>>>>>>>>>>>>>>>>>>>>>  Preventive Care for Adults  A healthy lifestyle and preventive care can promote health and wellness. Preventive health guidelines for men include the following key practices:  A routine yearly physical is a good way to check with your health care provider about your health and preventative screening. It is a chance to share any concerns and updates on your health and to receive a thorough exam.  Visit your dentist for a routine exam and preventative care every 6 months. Brush your teeth twice a day and floss once a day. Good oral hygiene prevents tooth decay and gum disease.  The frequency of eye exams is  based on your age, health, family medical history, use of contact lenses, and other factors. Follow your health care provider's recommendations for frequency of eye exams.  Eat a healthy diet. Foods such as vegetables, fruits, whole grains, low-fat dairy products, and lean protein foods contain the nutrients you need without too many calories. Decrease your intake of foods high in solid fats, added sugars, and salt. Eat the right amount of calories for you. Get information about a proper diet from your health care provider, if necessary.  Regular physical exercise is one of the most important things you can do for your health. Most adults should get at least 150 minutes of moderate-intensity exercise (any activity that increases your heart rate and causes you to sweat) each week. In addition, most adults need muscle-strengthening exercises on 2 or more days a week.  Maintain a healthy weight. The body mass index (BMI) is a screening tool to identify possible weight problems. It provides an estimate of body fat based on height and weight. Your health care provider can find your BMI and can help you achieve or maintain a healthy weight. For adults 20 years and older:  A BMI below 18.5 is considered underweight.  A BMI of 18.5 to 24.9 is normal.  A BMI of 25 to 29.9 is considered overweight.  A BMI of 30 and above is considered obese.  Maintain normal blood lipids and cholesterol levels by exercising and minimizing your intake of saturated fat. Eat a balanced diet with plenty of fruit and vegetables. Blood tests for lipids and cholesterol should begin at age 53 and be repeated every 5 years. If your lipid or cholesterol levels are high, you are over 50, or you are at high risk for heart disease, you may need your cholesterol levels checked more frequently. Ongoing high lipid and cholesterol levels should be treated with medicines if diet and exercise are not working.  If you smoke, find out from your  health care provider how to quit. If you do not use tobacco, do not start.  Lung cancer screening is recommended for adults aged 70-80 years who are at high risk for developing lung cancer because of a history of smoking. A yearly low-dose CT scan of the lungs is recommended for people who have at least a 30-pack-year history of smoking and are a current smoker or have quit within the past 15 years. A pack year of smoking is smoking an average of 1 pack of cigarettes a day for 1 year (for example: 1 pack a day for 30 years or 2 packs a day for 15 years). Yearly screening should continue until the smoker has stopped smoking for at least 15 years. Yearly screening should be stopped for people who develop a health problem that would prevent them from having lung cancer treatment.  If you choose to drink alcohol,  do not have more than 2 drinks per day. One drink is considered to be 12 ounces (355 mL) of beer, 5 ounces (148 mL) of wine, or 1.5 ounces (44 mL) of liquor.  High blood pressure causes heart disease and increases the risk of stroke. Your blood pressure should be checked. Ongoing high blood pressure should be treated with medicines, if weight loss and exercise are not effective.  If you are 36-9 years old, ask your health care provider if you should take aspirin to prevent heart disease.  Diabetes screening involves taking a blood sample to check your fasting blood sugar level. Testing should be considered at a younger age or be carried out more frequently if you are overweight and have at least 1 risk factor for diabetes.  Colorectal cancer can be detected and often prevented. Most routine colorectal cancer screening begins at the age of 83 and continues through age 87. However, your health care provider may recommend screening at an earlier age if you have risk factors for colon cancer. On a yearly basis, your health care provider may provide home test kits to check for hidden blood in the stool.  Use of a small camera at the end of a tube to directly examine the colon (sigmoidoscopy or colonoscopy) can detect the earliest forms of colorectal cancer. Talk to your health care provider about this at age 71, when routine screening begins. Direct exam of the colon should be repeated every 5-10 years through age 52, unless early forms of precancerous polyps or small growths are found.  Screening for abdominal aortic aneurysm (AAA)  are recommended for persons over age 40 who have history of hypertensionor who are current or former smokers.  Talk with your health care provider about prostate cancer screening.  Testicular cancer screening is recommended for adult males. Screening includes self-exam, a health care provider exam, and other screening tests. Consult with your health care provider about any symptoms you have or any concerns you have about testicular cancer.  Use sunscreen. Apply sunscreen liberally and repeatedly throughout the day. You should seek shade when your shadow is shorter than you. Protect yourself by wearing long sleeves, pants, a wide-brimmed hat, and sunglasses year round, whenever you are outdoors.  Once a month, do a whole-body skin exam, using a mirror to look at the skin on your back. Tell your health care provider about new moles, moles that have irregular borders, moles that are larger than a pencil eraser, or moles that have changed in shape or color.  Stay current with required vaccines (immunizations).  Influenza vaccine. All adults should be immunized every year.  Tetanus, diphtheria, and acellular pertussis (Td, Tdap) vaccine. An adult who has not previously received Tdap or who does not know his vaccine status should receive 1 dose of Tdap. This initial dose should be followed by tetanus and diphtheria toxoids (Td) booster doses every 10 years. Adults with an unknown or incomplete history of completing a 3-dose immunization series with Td-containing vaccines  should begin or complete a primary immunization series including a Tdap dose. Adults should receive a Td booster every 10 years.  Zoster vaccine. One dose is recommended for adults aged 8 years or older unless certain conditions are present.    Pneumococcal 13-valent conjugate (PCV13) vaccine. When indicated, a person who is uncertain of his immunization history and has no record of immunization should receive the PCV13 vaccine. An adult aged 7 years or older who has certain medical conditions and has  not been previously immunized should receive 1 dose of PCV13 vaccine. This PCV13 should be followed with a dose of pneumococcal polysaccharide (PPSV23) vaccine. The PPSV23 vaccine dose should be obtained at least 8 weeks after the dose of PCV13 vaccine. An adult aged 16 years or older who has certain medical conditions and previously received 1 or more doses of PPSV23 vaccine should receive 1 dose of PCV13. The PCV13 vaccine dose should be obtained 1 or more years after the last PPSV23 vaccine dose.    Pneumococcal polysaccharide (PPSV23) vaccine. When PCV13 is also indicated, PCV13 should be obtained first. All adults aged 42 years and older should be immunized. An adult younger than age 76 years who has certain medical conditions should be immunized. Any person who resides in a nursing home or long-term care facility should be immunized. An adult smoker should be immunized. People with an immunocompromised condition and certain other conditions should receive both PCV13 and PPSV23 vaccines. People with human immunodeficiency virus (HIV) infection should be immunized as soon as possible after diagnosis. Immunization during chemotherapy or radiation therapy should be avoided. Routine use of PPSV23 vaccine is not recommended for American Indians, [place] Natives, or people younger than 65 years unless there are medical conditions that require PPSV23 vaccine. When indicated, people who have unknown  immunization and have no record of immunization should receive PPSV23 vaccine. One-time revaccination 5 years after the first dose of PPSV23 is recommended for people aged 19-64 years who have chronic kidney failure, nephrotic syndrome, asplenia, or immunocompromised conditions. People who received 1-2 doses of PPSV23 before age 28 years should receive another dose of PPSV23 vaccine at age 72 years or later if at least 5 years have passed since the previous dose. Doses of PPSV23 are not needed for people immunized with PPSV23 at or after age 45 years.  Hepatitis A vaccine. Adults who wish to be protected from this disease, have certain high-risk conditions, work with hepatitis A-infected animals, work in hepatitis A research labs, or travel to or work in countries with a high rate of hepatitis A should be immunized. Adults who were previously unvaccinated and who anticipate close contact with an international adoptee during the first 60 days after arrival in the [place] from a country with a high rate of hepatitis A should be immunized.  Hepatitis B vaccine. Adults should be immunized if they wish to be protected from this disease, have certain high-risk conditions, may be exposed to blood or other infectious body fluids, are household contacts or sex partners of hepatitis B positive people, are clients or workers in certain care facilities, or travel to or work in countries with a high rate of hepatitis B.  Preventive Service / Frequency  Ages 37 to 67  Blood pressure check.  Lipid and cholesterol check.  Hepatitis C blood test.** / For any individual with known risks for hepatitis C.  Skin self-exam. / Monthly.  Influenza vaccine. / Every year.  Tetanus, diphtheria, and acellular pertussis (Tdap, Td) vaccine.** / Consult your health care provider. 1 dose of Td every 10 years.  HPV vaccine. / 3 doses over 6 months, if 35 or younger.  Measles, mumps, rubella (MMR) vaccine.** / You need  at least 1 dose of MMR if you were born in 1957 or later. You may also need a second dose.  Pneumococcal 13-valent conjugate (PCV13) vaccine.** / Consult your health care provider.  Pneumococcal polysaccharide (PPSV23) vaccine.** / 1 to 2 doses if you smoke cigarettes  or if you have certain conditions.  Meningococcal vaccine.** / 1 dose if you are age 25 to 33 years and a Market researcher living in a residence hall, or have one of several medical conditions. You may also need additional booster doses.  Hepatitis A vaccine.** / Consult your health care provider.  Hepatitis B vaccine.** / Consult your health care provider. +++++++++ Recommend Adult Low Dose Aspirin or  coated  Aspirin 81 mg daily  To reduce risk of Colon Cancer 20 %,  Skin Cancer 26 % ,  Melanoma 46%  and  Pancreatic cancer 60% ++++++++++++++++++ Vitamin D goal  is between 70-100.  Please make sure that you are taking your Vitamin D as directed.  It is very important as a natural anti-inflammatory  helping hair, skin, and nails, as well as reducing stroke and heart attack risk.  It helps your bones and helps with mood. It also decreases numerous cancer risks so please take it as directed.  Low Vit D is associated with a 200-300% higher risk for CANCER  and 200-300% higher risk for HEART   ATTACK  &  STROKE.   .....................................Marland Kitchen It is also associated with higher death rate at younger ages,  autoimmune diseases like Rheumatoid arthritis, Lupus, Multiple Sclerosis.    Also many other serious conditions, like depression, Alzheimer's Dementia, infertility, muscle aches, fatigue, fibromyalgia - just to name a few. +++++++++++++++++++++ Recommend the book "The END of DIETING" by Dr Excell Seltzer  & the book "The END of DIABETES " by Dr Excell Seltzer At [email or web address] - get book & Audio CD's    Being diabetic has a  300% increased risk for heart attack, stroke, cancer, and alzheimer- type vascular  dementia. It is very important that you work harder with diet by avoiding all foods that are white. Avoid white rice (brown & wild rice is OK), white potatoes (sweetpotatoes in moderation is OK), White bread or wheat bread or anything made out of white flour like bagels, donuts, rolls, buns, biscuits, cakes, pastries, cookies, pizza crust, and pasta (made from white flour & egg whites) - vegetarian pasta or spinach or wheat pasta is OK. Multigrain breads like Arnold's or [employer], or multigrain sandwich thins or flatbreads.  Diet, exercise and weight loss can reverse and cure diabetes in the early stages.  Diet, exercise and weight loss is very important in the control and prevention of complications of diabetes which affects every system in your body, ie. Brain - dementia/stroke, eyes - glaucoma/blindness, heart - heart attack/heart failure, kidneys - dialysis, stomach - gastric paralysis, intestines - malabsorption, nerves - severe painful neuritis, circulation - gangrene & loss of a leg(s), and finally cancer and Alzheimers.    I recommend avoid fried & greasy foods,  sweets/candy, white rice (brown or wild rice or Quinoa is OK), white potatoes (sweet potatoes are OK) - anything made from white flour - bagels, doughnuts, rolls, buns, biscuits,white and wheat breads, pizza crust and traditional pasta made of white flour & egg white(vegetarian pasta or spinach or wheat pasta is OK).  Multi-grain bread is OK - like multi-grain flat bread or sandwich thins. Avoid alcohol in excess. Exercise is also important.    Eat all the vegetables you want - avoid meat, especially red meat and dairy - especially cheese.  Cheese is the most concentrated form of trans-fats which is the worst thing to clog up our arteries. Veggie cheese is OK which can be found in the fresh produce section  at [employer] or [employer] or [employer]  +++++++++++++++++++ DASH Eating Plan  DASH stands for "Dietary Approaches to Stop  Hypertension."   The DASH eating plan is a healthy eating plan that has been shown to reduce high blood pressure (hypertension). Additional health benefits may include reducing the risk of type 2 diabetes mellitus, heart disease, and stroke. The DASH eating plan may also help with weight loss. WHAT DO I NEED TO KNOW ABOUT THE DASH EATING PLAN? For the DASH eating plan, you will follow these general guidelines:  Choose foods with a percent daily value for sodium of less than 5% (as listed on the food label).  Use salt-free seasonings or herbs instead of table salt or sea salt.  Check with your health care provider or pharmacist before using salt substitutes.  Eat lower-sodium products, often labeled as "lower sodium" or "no salt added."  Eat fresh foods.  Eat more vegetables, fruits, and low-fat dairy products.  Choose whole grains. Look for the word "whole" as the first word in the ingredient list.  Choose fish   Limit sweets, desserts, sugars, and sugary drinks.  Choose heart-healthy fats.  Eat veggie cheese   Eat more home-cooked food and less restaurant, buffet, and fast food.  Limit fried foods.  Cook foods using methods other than frying.  Limit canned vegetables. If you do use them, rinse them well to decrease the sodium.  When eating at a restaurant, ask that your food be prepared with less salt, or no salt if possible.                      WHAT FOODS CAN I EAT? Read Dr Fara Olden Fuhrman's books on The End of Dieting & The End of Diabetes  Grains Whole grain or whole wheat bread. Brown rice. Whole grain or whole wheat pasta. Quinoa, bulgur, and whole grain cereals. Low-sodium cereals. Corn or whole wheat flour tortillas. Whole grain cornbread. Whole grain crackers. Low-sodium crackers.  Vegetables Fresh or frozen vegetables (raw, steamed, roasted, or grilled). Low-sodium or reduced-sodium tomato and vegetable juices. Low-sodium or reduced-sodium tomato sauce and paste.  Low-sodium or reduced-sodium canned vegetables.   Fruits All fresh, canned (in natural juice), or frozen fruits.  Protein Products  All fish and seafood.  Dried beans, peas, or lentils. Unsalted nuts and seeds. Unsalted canned beans.  Dairy Low-fat dairy products, such as skim or 1% milk, 2% or reduced-fat cheeses, low-fat ricotta or cottage cheese, or plain low-fat yogurt. Low-sodium or reduced-sodium cheeses.  Fats and Oils Tub margarines without trans fats. Light or reduced-fat mayonnaise and salad dressings (reduced sodium). Avocado. Safflower, olive, or canola oils. Natural peanut or almond butter.  Other Unsalted popcorn and pretzels. The items listed above may not be a complete list of recommended foods or beverages. Contact your dietitian for more options.  +++++++++++++++++++  WHAT FOODS ARE NOT RECOMMENDED? Grains/ White flour or wheat flour White bread. White pasta. White rice. Refined cornbread. Bagels and croissants. Crackers that contain trans fat.  Vegetables  Creamed or fried vegetables. Vegetables in a . Regular canned vegetables. Regular canned tomato sauce and paste. Regular tomato and vegetable juices.  Fruits Dried fruits. Canned fruit in light or heavy syrup. Fruit juice.  Meat and Other Protein Products Meat in general - RED meat & White meat.  Fatty cuts of meat. Ribs, chicken wings, all processed meats as bacon, sausage, bologna, salami, fatback, hot dogs, bratwurst and packaged luncheon meats.  Dairy Whole or  2% milk, cream, half-and-half, and cream cheese. Whole-fat or sweetened yogurt. Full-fat cheeses or blue cheese. Non-dairy creamers and whipped toppings. Processed cheese, cheese spreads, or cheese curds.  Condiments Onion and garlic salt, seasoned salt, table salt, and sea salt. Canned and packaged gravies. Worcestershire sauce. Tartar sauce. Barbecue sauce. Teriyaki sauce. Soy sauce, including reduced sodium. Steak sauce. Fish sauce. Oyster  sauce. Cocktail sauce. Horseradish. Ketchup and mustard. Meat flavorings and tenderizers. Bouillon cubes. Hot sauce. Tabasco sauce. Marinades. Taco seasonings. Relishes.  Fats and Oils Butter, stick margarine, lard, shortening and bacon fat. Coconut, palm kernel, or palm oils. Regular salad dressings.  Pickles and olives. Salted popcorn and pretzels.  The items listed above may not be a complete list of foods and beverages to avoid.

## 2018-11-15 ENCOUNTER — Ambulatory Visit (INDEPENDENT_AMBULATORY_CARE_PROVIDER_SITE_OTHER): Payer: 59 | Admitting: Internal Medicine

## 2018-11-15 ENCOUNTER — Other Ambulatory Visit: Payer: Self-pay

## 2018-11-15 VITALS — BP 92/60 | HR 72 | Temp 97.0°F | Resp 16 | Ht 69.5 in | Wt 152.4 lb

## 2018-11-15 DIAGNOSIS — Z79899 Other long term (current) drug therapy: Secondary | ICD-10-CM

## 2018-11-15 DIAGNOSIS — Z111 Encounter for screening for respiratory tuberculosis: Secondary | ICD-10-CM

## 2018-11-15 DIAGNOSIS — E559 Vitamin D deficiency, unspecified: Secondary | ICD-10-CM

## 2018-11-15 DIAGNOSIS — R030 Elevated blood-pressure reading, without diagnosis of hypertension: Secondary | ICD-10-CM | POA: Diagnosis not present

## 2018-11-15 DIAGNOSIS — K2100 Gastro-esophageal reflux disease with esophagitis, without bleeding: Secondary | ICD-10-CM

## 2018-11-15 DIAGNOSIS — Z1212 Encounter for screening for malignant neoplasm of rectum: Secondary | ICD-10-CM

## 2018-11-15 DIAGNOSIS — Z0001 Encounter for general adult medical examination with abnormal findings: Secondary | ICD-10-CM

## 2018-11-15 DIAGNOSIS — F419 Anxiety disorder, unspecified: Secondary | ICD-10-CM

## 2018-11-15 DIAGNOSIS — R7309 Other abnormal glucose: Secondary | ICD-10-CM

## 2018-11-15 DIAGNOSIS — Z1211 Encounter for screening for malignant neoplasm of colon: Secondary | ICD-10-CM

## 2018-11-15 DIAGNOSIS — Z13220 Encounter for screening for lipoid disorders: Secondary | ICD-10-CM

## 2018-11-15 DIAGNOSIS — Z0000 Encounter for general adult medical examination without abnormal findings: Secondary | ICD-10-CM | POA: Diagnosis not present

## 2018-11-15 DIAGNOSIS — R5383 Other fatigue: Secondary | ICD-10-CM

## 2018-11-16 LAB — URINALYSIS, ROUTINE W REFLEX MICROSCOPIC
Bacteria, UA: NONE SEEN /HPF
Bilirubin Urine: NEGATIVE
Glucose, UA: NEGATIVE
Ketones, ur: NEGATIVE
Leukocytes,Ua: NEGATIVE
Nitrite: NEGATIVE
Specific Gravity, Urine: 1.029 (ref 1.001–1.03)
Squamous Epithelial / HPF: NONE SEEN /HPF (ref <=5)
WBC, UA: NONE SEEN /HPF (ref 0–5)
pH: 5.5 (ref 5.0–8.0)

## 2018-11-16 LAB — COMPLETE METABOLIC PANEL WITH GFR
AG Ratio: 2.3 (calc) (ref 1.0–2.5)
ALT: 10 U/L (ref 9–46)
AST: 13 U/L (ref 10–40)
Albumin: 5 g/dL (ref 3.6–5.1)
Alkaline phosphatase (APISO): 61 U/L (ref 36–130)
BUN: 19 mg/dL (ref 7–25)
CO2: 28 mmol/L (ref 20–32)
Calcium: 9.6 mg/dL (ref 8.6–10.3)
Chloride: 107 mmol/L (ref 98–110)
Creat: 1.02 mg/dL (ref 0.60–1.35)
GFR, Est African American: 112 mL/min/{1.73_m2} (ref >=60)
GFR, Est Non African American: 97 mL/min/{1.73_m2} (ref >=60)
Globulin: 2.2 g/dL (calc) (ref 1.9–3.7)
Glucose, Bld: 91 mg/dL (ref 65–99)
Potassium: 4.6 mmol/L (ref 3.5–5.3)
Sodium: 141 mmol/L (ref 135–146)
Total Bilirubin: 0.8 mg/dL (ref 0.2–1.2)
Total Protein: 7.2 g/dL (ref 6.1–8.1)

## 2018-11-16 LAB — LIPID PANEL
Cholesterol: 139 mg/dL (ref <200)
HDL: 43 mg/dL (ref >=40)
LDL Cholesterol (Calc): 81 mg/dL (calc)
Non-HDL Cholesterol (Calc): 96 mg/dL (calc) (ref <130)
Total CHOL/HDL Ratio: 3.2 (calc) (ref <5.0)
Triglycerides: 66 mg/dL (ref <150)

## 2018-11-16 LAB — INSULIN, RANDOM: Insulin: 5.6 u[IU]/mL

## 2018-11-16 LAB — MAGNESIUM: Magnesium: 2.4 mg/dL (ref 1.5–2.5)

## 2018-11-16 LAB — TSH: TSH: 1.06 mIU/L (ref 0.40–4.50)

## 2018-11-16 LAB — IRON,?TOTAL/TOTAL IRON BINDING CAP: Iron: 95 ug/dL (ref 50–180)

## 2018-11-16 LAB — TESTOSTERONE: Testosterone: 364 ng/dL (ref 250–827)

## 2018-11-16 LAB — CBC WITH DIFFERENTIAL/PLATELET
Absolute Monocytes: 300 cells/uL (ref 200–950)
Basophils Absolute: 39 cells/uL (ref 0–200)
Basophils Relative: 1 %
Eosinophils Absolute: 257 cells/uL (ref 15–500)
Eosinophils Relative: 6.6 %
HCT: 43.6 % (ref 38.5–50.0)
Hemoglobin: 15.2 g/dL (ref 13.2–17.1)
Lymphs Abs: 1303 cells/uL (ref 850–3900)
MCH: 30.8 pg (ref 27.0–33.0)
MCHC: 34.9 g/dL (ref 32.0–36.0)
MCV: 88.3 fL (ref 80.0–100.0)
MPV: 10 fL (ref 7.5–12.5)
Monocytes Relative: 7.7 %
Neutro Abs: 2001 cells/uL (ref 1500–7800)
Neutrophils Relative %: 51.3 %
Platelets: 274 10*3/uL (ref 140–400)
RBC: 4.94 10*6/uL (ref 4.20–5.80)
RDW: 12.2 % (ref 11.0–15.0)
Total Lymphocyte: 33.4 %
WBC: 3.9 10*3/uL (ref 3.8–10.8)

## 2018-11-16 LAB — VITAMIN B12: Vitamin B-12: 418 pg/mL (ref 200–1100)

## 2018-11-16 LAB — HEMOGLOBIN A1C
Hgb A1c MFr Bld: 5.1 % of total Hgb (ref <5.7)
Mean Plasma Glucose: 100 (calc)
eAG (mmol/L): 5.5 (calc)

## 2018-11-16 LAB — IRON, TOTAL/TOTAL IRON BINDING CAP
%SAT: 30 % (calc) (ref 20–48)
TIBC: 316 mcg/dL (calc) (ref 250–425)

## 2018-11-16 LAB — VITAMIN D 25 HYDROXY (VIT D DEFICIENCY, FRACTURES): Vit D, 25-Hydroxy: 33 ng/mL (ref 30–100)

## 2019-01-11 ENCOUNTER — Other Ambulatory Visit: Payer: Self-pay | Admitting: Internal Medicine

## 2019-01-11 MED ORDER — CITALOPRAM HYDROBROMIDE 20 MG PO TABS: ORAL_TABLET | ORAL | 3 refills | Status: DC

## 2019-10-27 ENCOUNTER — Ambulatory Visit: Payer: Self-pay | Attending: Internal Medicine

## 2019-10-27 DIAGNOSIS — Z23 Encounter for immunization: Secondary | ICD-10-CM

## 2019-10-27 NOTE — Progress Notes (Signed)
   Covid-19 Vaccination Clinic  Name:  Marcelles Clinard    MRN: [id number] DOB: 10/07/1985  10/27/2019  Mr. Overstreet was observed post Covid-19 immunization for 15 minutes without incident. He was provided with Vaccine Information Sheet and instruction to access the V-Safe system.   Mr. Reinoso was instructed to call 911 with any severe reactions post vaccine: Marland Kitchen Difficulty breathing  . Swelling of face and throat  . A fast heartbeat  . A bad rash all over body  . Dizziness and weakness   Immunizations Administered    Name Date Dose VIS Date Route   Pfizer COVID-19 Vaccine 10/27/2019 10:59 AM 0.3 mL 07/15/2019 Intramuscular   Manufacturer: [employer], [employer]   Lot: [id number]   NDC: [address]-4

## 2019-11-21 ENCOUNTER — Ambulatory Visit: Payer: Self-pay | Attending: Internal Medicine

## 2019-11-21 DIAGNOSIS — Z23 Encounter for immunization: Secondary | ICD-10-CM

## 2019-11-21 NOTE — Progress Notes (Signed)
   Covid-19 Vaccination Clinic  Name:  Alan Beck    MRN: [id number] DOB: 1985-10-17  11/21/2019  Mr. [name] was observed post Covid-19 immunization for 15 minutes without incident. He was provided with Vaccine Information Sheet and instruction to access the V-Safe system.   Mr. Swallows was instructed to call 911 with any severe reactions post vaccine: Marland Kitchen Difficulty breathing  . Swelling of face and throat  . A fast heartbeat  . A bad rash all over body  . Dizziness and weakness   Immunizations Administered    Name Date Dose VIS Date Route   Pfizer COVID-19 Vaccine 11/21/2019  2:20 PM 0.3 mL 09/28/2018 Intramuscular   Manufacturer: [employer], [employer]   Lot: [id number]   NDC: [address]-2

## 2019-12-20 ENCOUNTER — Encounter: Payer: 59 | Admitting: Internal Medicine

## 2020-03-27 ENCOUNTER — Encounter: Payer: Self-pay | Admitting: Internal Medicine

## 2020-03-27 NOTE — Progress Notes (Signed)
   N  O       S  H  O  W                                                                                                                   This very nice 34 y.o. single WM  presents for a Screening /Preventative Visit.  Patient has been followed expectantly  for elevated otr abnormal BP, lipids, abnormal glucose and Vitamin D Deficiency.     Patient's BP has been controlled at home.  Today's  . Patient denies any cardiac symptoms as chest pain, palpitations, shortness of breath, dizziness or ankle swelling.     Patient's  lipids have been controlled with diet.  Last lipids were at goal:  Lab Results  Component Value Date   CHOL 139 11/15/2018   HDL 43 11/15/2018   LDLCALC 81 11/15/2018   TRIG 66 11/15/2018   CHOLHDL 3.2 11/15/2018       Patient is expectantly monitored for glucose intolerance & abnormal glucose and patient denies reactive hypoglycemic symptoms, visual blurring, diabetic polys or paresthesias. Last A1c was Normal & at goal:  Lab Results  Component Value Date   HGBA1C 5.1 11/15/2018        Finally, patient has history of Vitamin D Deficiency  ("25" / 2009)and last vitamin D was  Very low:  Lab Results  Component Value Date   VD25OH 33 11/15/2018    Current Outpatient Medications on File Prior to Visit  Medication Sig  . Cholecalciferol (VITAMIN D PO) Take by mouth. Takes occasionally  . citalopram (CELEXA) 20 MG tablet Take 1 tablet Daily for Mood  . omeprazole (PRILOSEC OTC) 20 MG tablet Take 20 mg by mouth daily.  . Zinc 50 MG TABS Take by mouth.   No current facility-administered medications on file prior to visit.

## 2020-03-28 ENCOUNTER — Ambulatory Visit (INDEPENDENT_AMBULATORY_CARE_PROVIDER_SITE_OTHER): Payer: Self-pay | Admitting: Internal Medicine

## 2020-03-28 DIAGNOSIS — Z13220 Encounter for screening for lipoid disorders: Secondary | ICD-10-CM

## 2020-03-28 DIAGNOSIS — E559 Vitamin D deficiency, unspecified: Secondary | ICD-10-CM

## 2020-03-28 DIAGNOSIS — R030 Elevated blood-pressure reading, without diagnosis of hypertension: Secondary | ICD-10-CM

## 2020-03-28 DIAGNOSIS — K219 Gastro-esophageal reflux disease without esophagitis: Secondary | ICD-10-CM

## 2020-03-28 DIAGNOSIS — R5383 Other fatigue: Secondary | ICD-10-CM

## 2020-03-28 DIAGNOSIS — Z111 Encounter for screening for respiratory tuberculosis: Secondary | ICD-10-CM

## 2020-03-28 DIAGNOSIS — R7309 Other abnormal glucose: Secondary | ICD-10-CM

## 2020-03-28 DIAGNOSIS — Z5329 Procedure and treatment not carried out because of patient's decision for other reasons: Secondary | ICD-10-CM

## 2020-06-20 ENCOUNTER — Other Ambulatory Visit: Payer: Self-pay | Admitting: Internal Medicine

## 2020-06-20 MED ORDER — CITALOPRAM HYDROBROMIDE 20 MG PO TABS: ORAL_TABLET | ORAL | 0 refills | Status: AC

## 2020-08-15 ENCOUNTER — Encounter: Payer: Self-pay | Admitting: Internal Medicine

## 2020-08-15 ENCOUNTER — Ambulatory Visit (INDEPENDENT_AMBULATORY_CARE_PROVIDER_SITE_OTHER): Payer: BC Managed Care – PPO | Admitting: Internal Medicine

## 2020-08-15 ENCOUNTER — Other Ambulatory Visit: Payer: Self-pay

## 2020-08-15 VITALS — BP 133/75 | HR 82 | Temp 97.6°F | Resp 16 | Ht 70.0 in | Wt 152.6 lb

## 2020-08-15 DIAGNOSIS — Z131 Encounter for screening for diabetes mellitus: Secondary | ICD-10-CM

## 2020-08-15 DIAGNOSIS — Z0000 Encounter for general adult medical examination without abnormal findings: Secondary | ICD-10-CM

## 2020-08-15 DIAGNOSIS — Z1329 Encounter for screening for other suspected endocrine disorder: Secondary | ICD-10-CM | POA: Diagnosis not present

## 2020-08-15 DIAGNOSIS — Z1389 Encounter for screening for other disorder: Secondary | ICD-10-CM

## 2020-08-15 DIAGNOSIS — K219 Gastro-esophageal reflux disease without esophagitis: Secondary | ICD-10-CM

## 2020-08-15 DIAGNOSIS — Z13220 Encounter for screening for lipoid disorders: Secondary | ICD-10-CM | POA: Diagnosis not present

## 2020-08-15 DIAGNOSIS — Z111 Encounter for screening for respiratory tuberculosis: Secondary | ICD-10-CM

## 2020-08-15 DIAGNOSIS — Z130 Encounter for screening for diseases of the blood and blood-forming organs and certain disorders involving the immune mechanism: Secondary | ICD-10-CM | POA: Diagnosis not present

## 2020-08-15 DIAGNOSIS — Z79899 Other long term (current) drug therapy: Secondary | ICD-10-CM

## 2020-08-15 DIAGNOSIS — F419 Anxiety disorder, unspecified: Secondary | ICD-10-CM

## 2020-08-15 DIAGNOSIS — E559 Vitamin D deficiency, unspecified: Secondary | ICD-10-CM | POA: Diagnosis not present

## 2020-08-15 DIAGNOSIS — R7309 Other abnormal glucose: Secondary | ICD-10-CM

## 2020-08-15 DIAGNOSIS — R030 Elevated blood-pressure reading, without diagnosis of hypertension: Secondary | ICD-10-CM

## 2020-08-15 DIAGNOSIS — Z0001 Encounter for general adult medical examination with abnormal findings: Secondary | ICD-10-CM

## 2020-08-15 DIAGNOSIS — R5383 Other fatigue: Secondary | ICD-10-CM

## 2020-08-15 MED ORDER — ALPRAZOLAM 0.5 MG PO TABS: ORAL_TABLET | ORAL | 0 refills | Status: DC

## 2020-08-15 NOTE — Progress Notes (Signed)
Annual  Screening/Preventative Visit  & Comprehensive Evaluation & Examination          This very nice 35 y.o. single WM  presents for a Screening /Preventative Visit.  Patient has been followed expectantly  for elevated or abnormal BP, abnormal glucose and Vitamin D Deficiency.      Patient's BP has been controlled at home.  Today's BP is at goal - 133/75. Patient denies any cardiac symptoms as chest pain, palpitations, shortness of breath, dizziness or ankle swelling.      Patient's hyperlipidemia is controlled with diet. Last lipids were at goal:  Lab Results  Component Value Date   CHOL 139 11/15/2018   HDL 43 11/15/2018   LDLCALC 81 11/15/2018   TRIG 66 11/15/2018   CHOLHDL 3.2 11/15/2018       Patient is expectantly monitored for glucose intolerance & abnormal glucose and patient denies reactive hypoglycemic symptoms, visual blurring, diabetic polys or paresthesias. Last A1c was normal & at goal:   Lab Results  Component Value Date   HGBA1C 5.1 11/15/2018        Finally, patient has history of Vitamin D Deficiency ("25" /2009)and last vitamin D was still low off supplementation:   Lab Results  Component Value Date   VD25OH 33 11/15/2018    Current Outpatient Medications on File Prior to Visit  Medication Sig  . VITAMIN D  Takes occasionally  . citalopram 20 MG tablet Take      1 tablet      Daily       for Mood    Allergies  Allergen Reactions  . Cephalosporins Rash    Past Medical History:  Diagnosis Date  . Asthma   . Kidney stones   . Orchitis, left 2013   Dr Isabel Caprice  . Vitamin D deficiency    Health Maintenance  Topic Date Due  . Hepatitis C Screening  Never done  . INFLUENZA VACCINE  03/04/2020  . TETANUS/TDAP  08/26/2026  . COVID-19 Vaccine  Completed  . HIV Screening  Completed   Immunization History  Administered Date(s) Administered  . Influenza-Unspecified 03/20/2019  . PFIZER SARS-COV-2 Vaccination 10/27/2019, 11/21/2019  . PPD  Test 08/26/2016, 11/15/2018  . Tdap 08/26/2016    Past Surgical History:  Procedure Laterality Date  . APPENDECTOMY  2007  . EYE SURGERY Left    Lens removed  . VITRECTOMY Left 03/2007   Rt Retinal Detachment & Rt CE/IOL  . WISDOM TOOTH EXTRACTION     Family History  Problem Relation Age of Onset  . GER disease Father   . Colon cancer Maternal Aunt   . Cancer Maternal Uncle        type unknown  . Hypertension Paternal Grandmother   . Breast cancer Paternal Grandmother   . Diabetes Paternal Grandmother   . Kidney disease Paternal Grandmother   . Hypertension Paternal Grandfather   . Cancer Paternal Grandfather        type unknown  . Cancer Cousin        type unknown  . Seizures Cousin   . Prostate cancer Maternal Grandfather    Social History   Socioeconomic History  . Marital status: Single    Spouse name: Not on file  . Number of children: 0  . Years of education: Not on file  . Highest education level: Not on file  Occupational History  . Occupation: account analyst  Tobacco Use  . Smoking status: Never Smoker  .  Smokeless tobacco: Never Used  Substance and Sexual Activity  . Alcohol use: Yes    Comment: rarely  . Drug use: No  . Sexual activity: Not on file    ROS Constitutional: Denies fever, chills, weight loss/gain, headaches, insomnia,  night sweats or change in appetite. Does c/o fatigue. Eyes: Denies redness, blurred vision, diplopia, discharge, itchy or watery eyes.  ENT: Denies discharge, congestion, post nasal drip, epistaxis, sore throat, earache, hearing loss, dental pain, Tinnitus, Vertigo, Sinus pain or snoring.  Cardio: Denies chest pain, palpitations, irregular heartbeat, syncope, dyspnea, diaphoresis, orthopnea, PND, claudication or edema Respiratory: denies cough, dyspnea, DOE, pleurisy, hoarseness, laryngitis or wheezing.  Gastrointestinal: Denies dysphagia, heartburn, reflux, water brash, pain, cramps, nausea, vomiting, bloating, diarrhea,  constipation, hematemesis, melena, hematochezia, jaundice or hemorrhoids Genitourinary: Denies dysuria, frequency, urgency, nocturia, hesitancy, discharge, hematuria or flank pain Musculoskeletal: Denies arthralgia, myalgia, stiffness, Jt. Swelling, pain, limp or strain/sprain. Denies Falls. Skin: Denies puritis, rash, hives, warts, acne, eczema or change in skin lesion Neuro: No weakness, tremor, incoordination, spasms, paresthesia or pain Psychiatric: Denies confusion, memory loss or sensory loss. Denies Depression. Endocrine: Denies change in weight, skin, hair change, nocturia, and paresthesia, diabetic polys, visual blurring or hyper / hypo glycemic episodes.  Heme/Lymph: No excessive bleeding, bruising or enlarged lymph nodes.  Physical Exam  BP 133/75   Pulse 82   Temp 97.6 F (36.4 C)   Resp 16   Ht 5\' 10"  (1.778 m)   Wt 152 lb 9.6 oz (69.2 kg)   SpO2 96%   BMI 21.90 kg/m   General Appearance: Well nourished and well groomed and in no apparent distress.  Eyes: PERRLA, EOMs, conjunctiva no swelling or erythema, normal fundi and vessels. Sinuses: No frontal/maxillary tenderness ENT/Mouth: EACs patent / TMs  nl. Nares clear without erythema, swelling, mucoid exudates. Oral hygiene is good. No erythema, swelling, or exudate. Tongue normal, non-obstructing. Tonsils not swollen or erythematous. Hearing normal.  Neck: Supple, thyroid not palpable. No bruits, nodes or JVD. Respiratory: Respiratory effort normal.  BS equal and clear bilateral without rales, rhonci, wheezing or stridor. Cardio: Heart sounds are normal with regular rate and rhythm and no murmurs, rubs or gallops. Peripheral pulses are normal and equal bilaterally without edema. No aortic or femoral bruits. Chest: symmetric with normal excursions and percussion.  Abdomen: Soft, with Nl bowel sounds. Nontender, no guarding, rebound, hernias, masses, or organomegaly.  Lymphatics: Non tender without lymphadenopathy.   Musculoskeletal: Full ROM all peripheral extremities, joint stability, 5/5 strength, and normal gait. Skin: Warm and dry without rashes, lesions, cyanosis, clubbing or  ecchymosis.  Neuro: Cranial nerves intact, reflexes equal bilaterally. Normal muscle tone, no cerebellar symptoms. Sensation intact.  Pysch: Alert and oriented X 3 with normal affect, insight and judgment appropriate.   Assessment and Plan  1. Annual Preventative/Screening Exam    2. Elevated BP without diagnosis of hypertension  - CBC with Differential/Platelet - COMPLETE METABOLIC PANEL WITH GFR - Magnesium - TSH - Urinalysis, Routine w reflex microscopic  3. Abnormal glucose  - Hemoglobin A1c - Insulin, random  4. Vitamin D deficiency  - VITAMIN D 25 Hydroxy   5. Screening examination for pulmonary tuberculosis  - TB Skin Test  6. Gastroesophageal reflux disease without esophagitis  - CBC with Differential/Platelet  7. Fatigue  - Vitamin B12 - Testosterone - Iron,Total/Total Iron Binding Cap  8. Medication management  - CBC with Differential/Platelet - Magnesium - TSH - Hemoglobin A1c - Insulin, random - VITAMIN D 25 Hydroxy  Patient was counseled in prudent diet, weight control to achieve/maintain BMI less than 25, BP monitoring, regular exercise and medications as discussed.  Discussed med effects and SE's. Routine screening labs and tests as requested with regular follow-up as recommended. Over 40 minutes of exam, counseling, chart review and high complex critical decision making was performed   Kirtland Bouchard, MD

## 2020-08-15 NOTE — Patient Instructions (Signed)
Due to recent changes in healthcare laws, you may see the results of your imaging and laboratory studies on MyChart before your provider has had a chance to review them.  We understand that in some cases there may be results that are confusing or concerning to you. Not all laboratory results come back in the same time frame and the provider may be waiting for multiple results in order to interpret others.  Please give [place] 48 hours in order for your provider to thoroughly review all the results before contacting the office for clarification of your results.   +++++++++++++++++++++++++++++++++  Vit D  & Vit C 1,000 mg   are recommended to help protect  against the Covid-19 and other Corona viruses.    Also it's recommended  to take  Zinc 50 mg  to help  protect against the Covid-19   and best place to get  is also on [email or web address]  and don't pay more than 6-8 cents /pill !  ================================= Coronavirus (COVID-19) Are you at risk?  Are you at risk for the Coronavirus (COVID-19)?  To be considered HIGH RISK for Coronavirus (COVID-19), you have to meet the following criteria:  . Traveled to [place], [place], [place], [place] or [place]; or in the [place] to [place], [place], [place]  . or [place]; and have fever, cough, and shortness of breath within the last 2 weeks of travel OR . Been in close contact with a person diagnosed with COVID-19 within the last 2 weeks and have  . fever, cough,and shortness of breath .  . IF YOU DO NOT MEET THESE CRITERIA, YOU ARE CONSIDERED LOW RISK FOR COVID-19.  What to do if you are HIGH RISK for COVID-19?  Marland Kitchen If you are having a medical emergency, call 911. . Seek medical care right away. Before you go to a doctor's office, urgent care or emergency department, .  call ahead and tell them about your recent travel, contact with someone diagnosed with COVID-19  .  and your symptoms.  . You should receive instructions from your  physician's office regarding next steps of care.  . When you arrive at healthcare provider, tell the healthcare staff immediately you have returned from  . visiting [place], [place], [place], [place] or [place]; or traveled in the [place] to [place], [place],  . [place] or [place] in the last two weeks or you have been in close contact with a person diagnosed with  . COVID-19 in the last 2 weeks.   . Tell the health care staff about your symptoms: fever, cough and shortness of breath. . After you have been seen by a medical provider, you will be either: o Tested for (COVID-19) and discharged home on quarantine except to seek medical care if  o symptoms worsen, and asked to  - Stay home and avoid contact with others until you get your results (4-5 days)  - Avoid travel on public transportation if possible (such as bus, train, or airplane) or o Sent to the Emergency Department by EMS for evaluation, COVID-19 testing  and  o possible admission depending on your condition and test results.  What to do if you are LOW RISK for COVID-19?  Reduce your risk of any infection by using the same precautions used for avoiding the common cold or flu:  Marland Kitchen Wash your hands often with soap and warm water for at least 20 seconds.  If soap and water are not readily  available,  . use an alcohol-based hand sanitizer with at least 60% alcohol.  . If coughing or sneezing, cover your mouth and nose by coughing or sneezing into the elbow areas of your shirt or coat, .  into a tissue or into your sleeve (not your hands). . Avoid shaking hands with others and consider head nods or verbal greetings only. . Avoid touching your eyes, nose, or mouth with unwashed hands.  . Avoid close contact with people who are sick. . Avoid places or events with large numbers of people in one location, like concerts or sporting events. . Carefully consider travel plans you have or are making. . If you are planning any travel  outside or inside the [place], visit the CDC's Travelers' Health webpage for the latest health notices. . If you have some symptoms but not all symptoms, continue to monitor at home and seek medical attention  . if your symptoms worsen. . If you are having a medical emergency, call 911. >>>>>>>>>>>>>>>>>>>>>>>> Preventive Care for Adults  A healthy lifestyle and preventive care can promote health and wellness. Preventive health guidelines for men include the following key practices:  A routine yearly physical is a good way to check with your health care provider about your health and preventative screening. It is a chance to share any concerns and updates on your health and to receive a thorough exam.  Visit your dentist for a routine exam and preventative care every 6 months. Brush your teeth twice a day and floss once a day. Good oral hygiene prevents tooth decay and gum disease.  The frequency of eye exams is based on your age, health, family medical history, use of contact lenses, and other factors. Follow your health care provider's recommendations for frequency of eye exams.  Eat a healthy diet. Foods such as vegetables, fruits, whole grains, low-fat dairy products, and lean protein foods contain the nutrients you need without too many calories. Decrease your intake of foods high in solid fats, added sugars, and salt. Eat the right amount of calories for you. Get information about a proper diet from your health care provider, if necessary.  Regular physical exercise is one of the most important things you can do for your health. Most adults should get at least 150 minutes of moderate-intensity exercise (any activity that increases your heart rate and causes you to sweat) each week. In addition, most adults need muscle-strengthening exercises on 2 or more days a week.  Maintain a healthy weight. The body mass index (BMI) is a screening tool to identify possible weight problems. It provides an  estimate of body fat based on height and weight. Your health care provider can find your BMI and can help you achieve or maintain a healthy weight. For adults 20 years and older:  A BMI below 18.5 is considered underweight.  A BMI of 18.5 to 24.9 is normal.  A BMI of 25 to 29.9 is considered overweight.  A BMI of 30 and above is considered obese.  Maintain normal blood lipids and cholesterol levels by exercising and minimizing your intake of saturated fat. Eat a balanced diet with plenty of fruit and vegetables. Blood tests for lipids and cholesterol should begin at age 45 and be repeated every 5 years. If your lipid or cholesterol levels are high, you are over 50, or you are at high risk for heart disease, you may need your cholesterol levels checked more frequently. Ongoing high lipid and cholesterol levels should be treated  with medicines if diet and exercise are not working.  If you smoke, find out from your health care provider how to quit. If you do not use tobacco, do not start.  Lung cancer screening is recommended for adults aged 2-80 years who are at high risk for developing lung cancer because of a history of smoking. A yearly low-dose CT scan of the lungs is recommended for people who have at least a 30-pack-year history of smoking and are a current smoker or have quit within the past 15 years. A pack year of smoking is smoking an average of 1 pack of cigarettes a day for 1 year (for example: 1 pack a day for 30 years or 2 packs a day for 15 years). Yearly screening should continue until the smoker has stopped smoking for at least 15 years. Yearly screening should be stopped for people who develop a health problem that would prevent them from having lung cancer treatment.  If you choose to drink alcohol, do not have more than 2 drinks per day. One drink is considered to be 12 ounces (355 mL) of beer, 5 ounces (148 mL) of wine, or 1.5 ounces (44 mL) of liquor.  High blood pressure  causes heart disease and increases the risk of stroke. Your blood pressure should be checked. Ongoing high blood pressure should be treated with medicines, if weight loss and exercise are not effective.  If you are 35-59 years old, ask your health care provider if you should take aspirin to prevent heart disease.  Diabetes screening involves taking a blood sample to check your fasting blood sugar level. Testing should be considered at a younger age or be carried out more frequently if you are overweight and have at least 1 risk factor for diabetes.  Colorectal cancer can be detected and often prevented. Most routine colorectal cancer screening begins at the age of 50 and continues through age 67. However, your health care provider may recommend screening at an earlier age if you have risk factors for colon cancer. On a yearly basis, your health care provider may provide home test kits to check for hidden blood in the stool. Use of a small camera at the end of a tube to directly examine the colon (sigmoidoscopy or colonoscopy) can detect the earliest forms of colorectal cancer. Talk to your health care provider about this at age 75, when routine screening begins. Direct exam of the colon should be repeated every 5-10 years through age 50, unless early forms of precancerous polyps or small growths are found.  Screening for abdominal aortic aneurysm (AAA)  are recommended for persons over age 31 who have history of hypertensionor who are current or former smokers.  Talk with your health care provider about prostate cancer screening.  Testicular cancer screening is recommended for adult males. Screening includes self-exam, a health care provider exam, and other screening tests. Consult with your health care provider about any symptoms you have or any concerns you have about testicular cancer.  Use sunscreen. Apply sunscreen liberally and repeatedly throughout the day. You should seek shade when your shadow  is shorter than you. Protect yourself by wearing long sleeves, pants, a wide-brimmed hat, and sunglasses year round, whenever you are outdoors.  Once a month, do a whole-body skin exam, using a mirror to look at the skin on your back. Tell your health care provider about new moles, moles that have irregular borders, moles that are larger than a pencil eraser, or moles that  have changed in shape or color.  Stay current with required vaccines (immunizations).  Influenza vaccine. All adults should be immunized every year.  Tetanus, diphtheria, and acellular pertussis (Td, Tdap) vaccine. An adult who has not previously received Tdap or who does not know his vaccine status should receive 1 dose of Tdap. This initial dose should be followed by tetanus and diphtheria toxoids (Td) booster doses every 10 years. Adults with an unknown or incomplete history of completing a 3-dose immunization series with Td-containing vaccines should begin or complete a primary immunization series including a Tdap dose. Adults should receive a Td booster every 10 years.  Zoster vaccine. One dose is recommended for adults aged 60 years or older unless certain conditions are present.    Pneumococcal 13-valent conjugate (PCV13) vaccine. When indicated, a person who is uncertain of his immunization history and has no record of immunization should receive the PCV13 vaccine. An adult aged 19 years or older who has certain medical conditions and has not been previously immunized should receive 1 dose of PCV13 vaccine. This PCV13 should be followed with a dose of pneumococcal polysaccharide (PPSV23) vaccine. The PPSV23 vaccine dose should be obtained at least 8 weeks after the dose of PCV13 vaccine. An adult aged 19 years or older who has certain medical conditions and previously received 1 or more doses of PPSV23 vaccine should receive 1 dose of PCV13. The PCV13 vaccine dose should be obtained 1 or more years after the last PPSV23  vaccine dose.    Pneumococcal polysaccharide (PPSV23) vaccine. When PCV13 is also indicated, PCV13 should be obtained first. All adults aged 65 years and older should be immunized. An adult younger than age 65 years who has certain medical conditions should be immunized. Any person who resides in a nursing home or long-term care facility should be immunized. An adult smoker should be immunized. People with an immunocompromised condition and certain other conditions should receive both PCV13 and PPSV23 vaccines. People with human immunodeficiency virus (HIV) infection should be immunized as soon as possible after diagnosis. Immunization during chemotherapy or radiation therapy should be avoided. Routine use of PPSV23 vaccine is not recommended for American Indians, [place] Natives, or people younger than 65 years unless there are medical conditions that require PPSV23 vaccine. When indicated, people who have unknown immunization and have no record of immunization should receive PPSV23 vaccine. One-time revaccination 5 years after the first dose of PPSV23 is recommended for people aged 19-64 years who have chronic kidney failure, nephrotic syndrome, asplenia, or immunocompromised conditions. People who received 1-2 doses of PPSV23 before age 65 years should receive another dose of PPSV23 vaccine at age 65 years or later if at least 5 years have passed since the previous dose. Doses of PPSV23 are not needed for people immunized with PPSV23 at or after age 65 years.  Hepatitis A vaccine. Adults who wish to be protected from this disease, have certain high-risk conditions, work with hepatitis A-infected animals, work in hepatitis A research labs, or travel to or work in countries with a high rate of hepatitis A should be immunized. Adults who were previously unvaccinated and who anticipate close contact with an international adoptee during the first 60 days after arrival in the [place] from a country with a  high rate of hepatitis A should be immunized.  Hepatitis B vaccine. Adults should be immunized if they wish to be protected from this disease, have certain high-risk conditions, may be exposed to blood or other infectious   body fluids, are household contacts or sex partners of hepatitis B positive people, are clients or workers in certain care facilities, or travel to or work in countries with a high rate of hepatitis B.  Preventive Service / Frequency  Ages 19 to 28  Blood pressure check.  Lipid and cholesterol check.  Hepatitis C blood test.** / For any individual with known risks for hepatitis C.  Skin self-exam. / Monthly.  Influenza vaccine. / Every year.  Tetanus, diphtheria, and acellular pertussis (Tdap, Td) vaccine.** / Consult your health care provider. 1 dose of Td every 10 years.  HPV vaccine. / 3 doses over 6 months, if 49 or younger.  Measles, mumps, rubella (MMR) vaccine.** / You need at least 1 dose of MMR if you were born in 1957 or later. You may also need a second dose.  Pneumococcal 13-valent conjugate (PCV13) vaccine.** / Consult your health care provider.  Pneumococcal polysaccharide (PPSV23) vaccine.** / 1 to 2 doses if you smoke cigarettes or if you have certain conditions.  Meningococcal vaccine.** / 1 dose if you are age 33 to 88 years and a Market researcher living in a residence hall, or have one of several medical conditions. You may also need additional booster doses.  Hepatitis A vaccine.** / Consult your health care provider.  Hepatitis B vaccine.** / Consult your health care provider. +++++++++ Recommend Adult Low Dose Aspirin or  coated  Aspirin 81 mg daily  To reduce risk of Colon Cancer 40 %,  Skin Cancer 26 % ,  Melanoma 46%  and  Pancreatic cancer 60% ++++++++++++++++++ Vitamin D goal  is between 70-100.  Please make sure that you are taking your Vitamin D as directed.  It is very important as a natural anti-inflammatory   helping hair, skin, and nails, as well as reducing stroke and heart attack risk.  It helps your bones and helps with mood. It also decreases numerous cancer risks so please take it as directed.  Low Vit D is associated with a 200-300% higher risk for CANCER  and 200-300% higher risk for HEART   ATTACK  &  STROKE.   .....................................Marland Kitchen It is also associated with higher death rate at younger ages,  autoimmune diseases like Rheumatoid arthritis, Lupus, Multiple Sclerosis.    Also many other serious conditions, like depression, Alzheimer's Dementia, infertility, muscle aches, fatigue, fibromyalgia - just to name a few. +++++++++++++++++++++ Recommend the book "The END of DIETING" by Dr Excell Seltzer  & the book "The END of DIABETES " by Dr Excell Seltzer At [email or web address] - get book & Audio CD's    Being diabetic has a  300% increased risk for heart attack, stroke, cancer, and alzheimer- type vascular dementia. It is very important that you work harder with diet by avoiding all foods that are white. Avoid white rice (brown & wild rice is OK), white potatoes (sweetpotatoes in moderation is OK), White bread or wheat bread or anything made out of white flour like bagels, donuts, rolls, buns, biscuits, cakes, pastries, cookies, pizza crust, and pasta (made from white flour & egg whites) - vegetarian pasta or spinach or wheat pasta is OK. Multigrain breads like Arnold's or [employer], or multigrain sandwich thins or flatbreads.  Diet, exercise and weight loss can reverse and cure diabetes in the early stages.  Diet, exercise and weight loss is very important in the control and prevention of complications of diabetes which affects every system in your body, ie. Brain - dementia/stroke, eyes -  glaucoma/blindness, heart - heart attack/heart failure, kidneys - dialysis, stomach - gastric paralysis, intestines - malabsorption, nerves - severe painful neuritis, circulation - gangrene & loss of a  leg(s), and finally cancer and Alzheimers.    I recommend avoid fried & greasy foods,  sweets/candy, white rice (brown or wild rice or Quinoa is OK), white potatoes (sweet potatoes are OK) - anything made from white flour - bagels, doughnuts, rolls, buns, biscuits,white and wheat breads, pizza crust and traditional pasta made of white flour & egg white(vegetarian pasta or spinach or wheat pasta is OK).  Multi-grain bread is OK - like multi-grain flat bread or sandwich thins. Avoid alcohol in excess. Exercise is also important.    Eat all the vegetables you want - avoid meat, especially red meat and dairy - especially cheese.  Cheese is the most concentrated form of trans-fats which is the worst thing to clog up our arteries. Veggie cheese is OK which can be found in the fresh produce section at [employer] or [employer] or [employer]  +++++++++++++++++++ DASH Eating Plan  DASH stands for "Dietary Approaches to Stop Hypertension."   The DASH eating plan is a healthy eating plan that has been shown to reduce high blood pressure (hypertension). Additional health benefits may include reducing the risk of type 2 diabetes mellitus, heart disease, and stroke. The DASH eating plan may also help with weight loss. WHAT DO I NEED TO KNOW ABOUT THE DASH EATING PLAN? For the DASH eating plan, you will follow these general guidelines:  Choose foods with a percent daily value for sodium of less than 5% (as listed on the food label).  Use salt-free seasonings or herbs instead of table salt or sea salt.  Check with your health care provider or pharmacist before using salt substitutes.  Eat lower-sodium products, often labeled as "lower sodium" or "no salt added."  Eat fresh foods.  Eat more vegetables, fruits, and low-fat dairy products.  Choose whole grains. Look for the word "whole" as the first word in the ingredient list.  Choose fish   Limit sweets, desserts, sugars, and sugary drinks.  Choose  heart-healthy fats.  Eat veggie cheese   Eat more home-cooked food and less restaurant, buffet, and fast food.  Limit fried foods.  Cook foods using methods other than frying.  Limit canned vegetables. If you do use them, rinse them well to decrease the sodium.  When eating at a restaurant, ask that your food be prepared with less salt, or no salt if possible.                      WHAT FOODS CAN I EAT? Read Dr Fara Olden Fuhrman's books on The End of Dieting & The End of Diabetes  Grains Whole grain or whole wheat bread. Brown rice. Whole grain or whole wheat pasta. Quinoa, bulgur, and whole grain cereals. Low-sodium cereals. Corn or whole wheat flour tortillas. Whole grain cornbread. Whole grain crackers. Low-sodium crackers.  Vegetables Fresh or frozen vegetables (raw, steamed, roasted, or grilled). Low-sodium or reduced-sodium tomato and vegetable juices. Low-sodium or reduced-sodium tomato sauce and paste. Low-sodium or reduced-sodium canned vegetables.   Fruits All fresh, canned (in natural juice), or frozen fruits.  Protein Products  All fish and seafood.  Dried beans, peas, or lentils. Unsalted nuts and seeds. Unsalted canned beans.  Dairy Low-fat dairy products, such as skim or 1% milk, 2% or reduced-fat cheeses, low-fat ricotta or cottage cheese, or plain low-fat yogurt. Low-sodium  or reduced-sodium cheeses.  Fats and Oils Tub margarines without trans fats. Light or reduced-fat mayonnaise and salad dressings (reduced sodium). Avocado. Safflower, olive, or canola oils. Natural peanut or almond butter.  Other Unsalted popcorn and pretzels. The items listed above may not be a complete list of recommended foods or beverages. Contact your dietitian for more options.  +++++++++++++++++++  WHAT FOODS ARE NOT RECOMMENDED? Grains/ White flour or wheat flour White bread. White pasta. White rice. Refined cornbread. Bagels and croissants. Crackers that contain trans  fat.  Vegetables  Creamed or fried vegetables. Vegetables in a . Regular canned vegetables. Regular canned tomato sauce and paste. Regular tomato and vegetable juices.  Fruits Dried fruits. Canned fruit in light or heavy syrup. Fruit juice.  Meat and Other Protein Products Meat in general - RED meat & White meat.  Fatty cuts of meat. Ribs, chicken wings, all processed meats as bacon, sausage, bologna, salami, fatback, hot dogs, bratwurst and packaged luncheon meats.  Dairy Whole or 2% milk, cream, half-and-half, and cream cheese. Whole-fat or sweetened yogurt. Full-fat cheeses or blue cheese. Non-dairy creamers and whipped toppings. Processed cheese, cheese spreads, or cheese curds.  Condiments Onion and garlic salt, seasoned salt, table salt, and sea salt. Canned and packaged gravies. Worcestershire sauce. Tartar sauce. Barbecue sauce. Teriyaki sauce. Soy sauce, including reduced sodium. Steak sauce. Fish sauce. Oyster sauce. Cocktail sauce. Horseradish. Ketchup and mustard. Meat flavorings and tenderizers. Bouillon cubes. Hot sauce. Tabasco sauce. Marinades. Taco seasonings. Relishes.  Fats and Oils Butter, stick margarine, lard, shortening and bacon fat. Coconut, palm kernel, or palm oils. Regular salad dressings.  Pickles and olives. Salted popcorn and pretzels.  The items listed above may not be a complete list of foods and beverages to avoid.

## 2020-08-16 LAB — CBC WITH DIFFERENTIAL/PLATELET
Absolute Monocytes: 411 cells/uL (ref 200–950)
Basophils Absolute: 62 cells/uL (ref 0–200)
Basophils Relative: 1.2 %
Eosinophils Absolute: 390 cells/uL (ref 15–500)
Eosinophils Relative: 7.5 %
HCT: 46.1 % (ref 38.5–50.0)
Hemoglobin: 15.7 g/dL (ref 13.2–17.1)
Lymphs Abs: 1456 cells/uL (ref 850–3900)
MCH: 29.9 pg (ref 27.0–33.0)
MCHC: 34.1 g/dL (ref 32.0–36.0)
MCV: 87.8 fL (ref 80.0–100.0)
MPV: 10.1 fL (ref 7.5–12.5)
Monocytes Relative: 7.9 %
Neutro Abs: 2881 cells/uL (ref 1500–7800)
Neutrophils Relative %: 55.4 %
Platelets: 299 10*3/uL (ref 140–400)
RBC: 5.25 10*6/uL (ref 4.20–5.80)
RDW: 11.9 % (ref 11.0–15.0)
Total Lymphocyte: 28 %
WBC: 5.2 10*3/uL (ref 3.8–10.8)

## 2020-08-16 LAB — IRON, TOTAL/TOTAL IRON BINDING CAP
%SAT: 37 % (calc) (ref 20–48)
Iron: 125 ug/dL (ref 50–180)
TIBC: 334 mcg/dL (calc) (ref 250–425)

## 2020-08-16 LAB — COMPLETE METABOLIC PANEL WITH GFR
AG Ratio: 1.7 (calc) (ref 1.0–2.5)
ALT: 11 U/L (ref 9–46)
AST: 12 U/L (ref 10–40)
Albumin: 4.5 g/dL (ref 3.6–5.1)
Alkaline phosphatase (APISO): 56 U/L (ref 36–130)
BUN: 14 mg/dL (ref 7–25)
CO2: 27 mmol/L (ref 20–32)
Calcium: 9.5 mg/dL (ref 8.6–10.3)
Chloride: 106 mmol/L (ref 98–110)
Creat: 1.05 mg/dL (ref 0.60–1.35)
GFR, Est African American: 108 mL/min/{1.73_m2} (ref >=60)
GFR, Est Non African American: 93 mL/min/{1.73_m2} (ref >=60)
Globulin: 2.6 g/dL (calc) (ref 1.9–3.7)
Glucose, Bld: 78 mg/dL (ref 65–99)
Potassium: 4.3 mmol/L (ref 3.5–5.3)
Sodium: 140 mmol/L (ref 135–146)
Total Bilirubin: 0.7 mg/dL (ref 0.2–1.2)
Total Protein: 7.1 g/dL (ref 6.1–8.1)

## 2020-08-16 LAB — URINALYSIS, ROUTINE W REFLEX MICROSCOPIC
Bacteria, UA: NONE SEEN /HPF
Bilirubin Urine: NEGATIVE
Glucose, UA: NEGATIVE
Hyaline Cast: NONE SEEN /LPF
Leukocytes,Ua: NEGATIVE
Nitrite: NEGATIVE
Protein, ur: NEGATIVE
Specific Gravity, Urine: 1.023 (ref 1.001–1.03)
Squamous Epithelial / HPF: NONE SEEN /HPF (ref <=5)
WBC, UA: NONE SEEN /HPF (ref 0–5)
pH: 5.5 (ref 5.0–8.0)

## 2020-08-16 LAB — HEMOGLOBIN A1C
Hgb A1c MFr Bld: 5.1 % of total Hgb (ref <5.7)
Mean Plasma Glucose: 100 mg/dL
eAG (mmol/L): 5.5 mmol/L

## 2020-08-16 LAB — VITAMIN D 25 HYDROXY (VIT D DEFICIENCY, FRACTURES): Vit D, 25-Hydroxy: 33 ng/mL (ref 30–100)

## 2020-08-16 LAB — TSH: TSH: 1.41 mIU/L (ref 0.40–4.50)

## 2020-08-16 LAB — MAGNESIUM: Magnesium: 2.3 mg/dL (ref 1.5–2.5)

## 2020-08-16 LAB — VITAMIN B12: Vitamin B-12: 338 pg/mL (ref 200–1100)

## 2020-08-16 LAB — TESTOSTERONE: Testosterone: 376 ng/dL (ref 250–827)

## 2020-08-16 LAB — INSULIN, RANDOM: Insulin: 9.1 u[IU]/mL

## 2020-08-16 NOTE — Progress Notes (Signed)
========================================================== -   Test results slightly outside the reference range are not unusual. If there is anything important, I will review this with you,  otherwise it is considered normal test values.  If you have further questions,  please do not hesitate to contact me at the office or via My Chart.  ========================================================== ========================================================== - Both Normal - Great - No Diabetes ==========================================================  -  Vitamin D = 33 is Extremely & Dangerously Low   - Vitamin D goal is between 70-100.   fffffffffffffffffffffffffffffffffffffffffffffffffffffffffffffffffffffffffffffffffffffffffffffffffffffffffffffffffffffffff  - Please take Vitamin D 10,000 units /day (take 5,000 unit capsules x 2 capsules )  fffffffffffffffffffffffffffffffffffffffffffffffffffffffffffffffffffffffffffffffffffffffffffffffffffffffffffffffffffffffff  - It is very important as a natural anti-inflammatory and helping the  immune system protect against viral infections, like the Covid-19    helping hair, skin, and nails, as well as reducing stroke and  heart attack risk.   - It helps your bones and helps with mood.  - It also decreases numerous cancer risks so please  take it as directed.   - Low Vit D is associated with a 200-300% higher risk for  CANCER   and 200-300% higher risk for HEART   ATTACK  &  STROKE.    - It is also associated with higher death rate at younger ages,   autoimmune diseases like Rheumatoid arthritis, Lupus,  Multiple Sclerosis.     - Also many other serious conditions, like depression, Alzheimer's  Dementia, infertility, muscle aches, fatigue, fibromyalgia   - just to name a few. ==========================================================  - U/A is very concentrated (high specific gravity) and "trace hgb" is   of no significance to  worry about since no significant RBC's ==========================================================  -   -  Vitamin B12 =  318     Very Low  (Ideal or Goal Vit B12 is between 450 - 1,100)   Low Vit B12 may be associated with Anemia , Fatigue,   Peripheral Neuropathy, Dementia, "Brain Fog" & Depression  - Recommend take a sub-lingual form of Vitamin B12 tablet   1,000 to 5,000 mcg tab that you dissolve under your tongue /Daily   - Can get Lavonia Dana - best price at [employer] or on [employer] ==========================================================  -  Testosterone - Normal  ==========================================================  -  Iron levels - Normal  ==========================================================  -  All Else - CBC - Kidneys - Electrolytes - Liver - Magnesium & Thyroid    - all  Normal / OK ===========================================================

## 2020-11-01 ENCOUNTER — Ambulatory Visit (INDEPENDENT_AMBULATORY_CARE_PROVIDER_SITE_OTHER): Payer: BC Managed Care – PPO | Admitting: Internal Medicine

## 2020-11-01 ENCOUNTER — Other Ambulatory Visit: Payer: Self-pay

## 2020-11-01 VITALS — BP 108/76 | HR 95 | Temp 97.7°F | Resp 16 | Ht 70.0 in | Wt 152.0 lb

## 2020-11-01 DIAGNOSIS — R300 Dysuria: Secondary | ICD-10-CM

## 2020-11-01 DIAGNOSIS — Z113 Encounter for screening for infections with a predominantly sexual mode of transmission: Secondary | ICD-10-CM

## 2020-11-01 DIAGNOSIS — Z23 Encounter for immunization: Secondary | ICD-10-CM

## 2020-11-01 NOTE — Progress Notes (Signed)
    Future Appointments  Date Time Provider Department Center  08/30/2021 11:00 AM Lucky Cowboy, MD [facility] None   History of Present Illness:      This very nice34 y.o.single WM presents with concerns of recent casual STD exposure with contact reporting (+) Chlamydia culture. Patient denies any rectal discomfort  or discharge.     Medications  .  ALPRAZolam (XANAX) 0.5 MG tablet, Take     1/2 to 1 tablet      2 to 3 x /day      if needed for Anxiety Attack or Sleep  .  Cholecalciferol (VITAMIN D PO), Take by mouth. Takes occasionally  .  citalopram (CELEXA) 20 MG tablet, Take      1 tablet      Daily       for Mood  Problem list  He has Screening cholesterol level on their problem list.   Observations/Objective:   BP 108/76   Pulse 95   Temp 97.7 F (36.5 C)   Resp 16   Ht 5\' 10"  (1.778 m)   Wt 152 lb (68.9 kg)   SpO2 97%   BMI 21.81 kg/m   HEENT - WNL. Neck - supple.  Chest - Clear equal BS. Cor - Nl HS. RRR w/o sig MGR. PP 1(+). No edema. MS- FROM w/o deformities.  Gait Nl. GU- urethral & rectal swabs collected for Chlamydia & GC.  Neuro -  Nl w/o focal abnormalities.  Assessment and Plan:  1. Dysuria  - doxycycline 100 MG; Take 2 capsules Immediately, then 1 capsule 2 x /day  for 10 days  Disp: 22 caps; Rf: 1  2. Screen for STD (sexually transmitted disease)  - RPR - HIV Antibody (routine testing w rflx) - HSV(herpes simplex vrs) 1+2 ab-IgG - C. trachomatis/N. gonorrhoeae RNA - HPV 9-valent vaccine,Recombinat  3. Need for HPV vaccine  - HPV 9-valent vaccine,Recombinat  Follow Up Instructions:        I discussed the assessment and treatment plan with the patient. The patient was provided an opportunity to ask questions and all were answered. The patient agreed with the plan and demonstrated an understanding of the instructions.       The patient was advised to call back or seek an in-person evaluation if the symptoms worsen or if the  condition fails to improve as anticipated.   , MD

## 2020-11-01 NOTE — Patient Instructions (Addendum)
Safe Sex  Practicing safe sex means taking steps before and during sex to reduce your risk of:  Getting an STI (sexually transmitted infection).  Giving your partner an STI.  Unwanted or unplanned pregnancy.   How to practice safe sex   Ways you can practice safe sex    Limit your sexual partners to only one partner who is having sex with only you.  Avoid using alcohol and drugs before having sex. Alcohol and drugs can affect your judgment.  Before having sex with a new partner: ? Talk to your partner about past partners, past STIs, and drug use. ? Get screened for STIs and discuss the results with your partner. Ask your partner to get screened too.  Check your body regularly for sores, blisters, rashes, or unusual discharge. If you notice any of these problems, visit your health care provider.  Avoid sexual contact if you have symptoms of an infection or you are being treated for an STI.  While having sex, use a condom. Make sure to: ? Use a condom every time you have vaginal, oral, or anal sex. Both females and males should wear condoms during oral sex. ? Keep condoms in place from the beginning to the end of sexual activity. ? Use a latex condom, if possible. Latex condoms offer the best protection. ? Use only water-based lubricants with a condom. Using petroleum-based lubricants or oils will weaken the condom and increase the chance that it will break. ?   Ways your health care provider can help you practice safe sex   See your health care provider for regular screenings, exams, and tests for STIs.  Talk with your health care provider about what kind of birth control (contraception) is best for you.  Get vaccinated against hepatitis B and human papillomavirus (HPV).  If you are at risk of being infected with HIV (human immunodeficiency virus), talk with your health care provider about taking a prescription medicine to prevent HIV infection. You are at risk for HIV if  you: ? Are a man who has sex with other men. ? Are sexually active with more than one partner. ? Take drugs by injection. ? Have a sex partner who has HIV. ? Have unprotected sex. ? Have sex with someone who has sex with both men and women. ? Have had an STI. ?   Follow these instructions at home:   Take over-the-counter and prescription medicines only as told by your health care provider.  Keep all follow-up visits. This is important.   Where to find more information   Centers for Disease Control and Prevention: [employer]Exhibition.com.br  Planned Parenthood: www.plannedparenthood.org   Summary   Practicing safe sex means taking steps before and during sex to reduce your risk getting an STI, giving your partner an STI, and having an unwanted or unplanned pregnancy.  Before having sex with a new partner, talk to your partner about past partners, past STIs, and drug use.  Use a condom every time you have vaginal, oral, or anal sex. Both females and males should wear condoms during oral sex.  Check your body regularly for sores, blisters, rashes, or unusual discharge. If you notice any of these problems, visit your health care provider.  See your health care provider for regular screenings, exams, and tests for STIs.  ++++++++++++++++++++++++++++++   Chlamydia, Male Chlamydia is an STI (sexually transmitted infection) that is caused by bacteria. This infection spreads through sexual contact. Chlamydia can occur in different areas of the  body, including:  The urethra. This is the part of the body that drains urine from the bladder and through the penis.  The throat.  The rectum. This condition is not difficult to treat. However, if left untreated, chlamydia can lead to more serious health problems.  What are the causes? This condition is caused by the bacteria Chlamydia trachomatis. The bacteria are spread from an infected partner during sexual activity. Chlamydia can spread through  contact with the genitals, mouth, or rectum.  What increases the risk?  The following factors may make you more likely to develop this condition:  Not using a condom correctly or not using a condom every time you have sex.  Having a new sex partner or having more than one sex partner. Being a man who has sex with men (MSM).  What are the signs or symptoms?  In some cases, there are no symptoms, especially early in the infection. Having no symptoms is also called being asymptomatic. If symptoms develop, they may include:  Urinating often, or having a burning feeling during urination.  Pain or swelling in the testicles.  Watery, mucus-like discharge from the penis.  Redness, soreness, swelling, bleeding, or discharge from the rectum. This may occur if the infection was spread through anal sex.  Pain in the abdomen.  Itching, burning, or redness in the eyes, or discharge from the eyes.  Pain during sex.   How is this diagnosed?  This condition may be diagnosed with:  Urine tests.  Swab tests. Depending on your symptoms, your health care provider may use a cotton swab to collect discharge from your urethra or rectum to test for the bacteria.   How is this treated?  This condition is treated with antibiotic medicines.  Follow these instructions at home:  Sexual activity   Tell your sex partner or partners about your infection. These include any partners for oral, anal, or vaginal sex that you have had within 60 days of when your symptoms started. Sex partners should also be treated, even if they have no signs of the disease.    Do not have sex until you and your sex partners have completed treatment and your health care provider says it is okay. If your health care provider prescribed you a single-dose medicine as treatment, wait at least 7 days after taking the medicine before having sex.   General instructions   Take over-the-counter and prescription medicines  as told by your health care provider. Finish all antibiotic medicine even when you start to feel better.  It is up to you to get your test results. Ask your health care provider, or the department that is doing the test, when your results will be ready.  Get plenty of rest.  Eat a healthy, well-balanced diet.  Drink enough fluids to keep your urine pale yellow.  Keep all follow-up visits as told by your health care provider. This is important. You may need to be tested for infection again 3 months after treatment.   How is this prevented?  The only sure way to prevent chlamydia is to avoid having vaginal, anal, and oral sex. However, you can lower your risk by:  Using latex condoms correctly every time you have sex.  Not having multiple sexual partners.  Asking if your sex partner has been tested for STIs and had negative results.  Getting regular health screenings to check for STIs.   Contact a health care provider if:   You develop new symptoms or  your symptoms do not get better after you complete treatment.  You have a fever or chills.  You have pain during sex.  You develop new joint pain or swelling near your joints.  You have pain or soreness in your testicles.   Get help right away if:   Your pain gets worse and does not get better with medicine.  You have abnormal discharge.  You develop flu-like symptoms, such as night sweats, sore throat, or muscle aches.   Summary   Chlamydia is an STI (sexually transmitted infection) that is caused by bacteria. This infection spreads through sexual contact.  This condition is not difficult to treat. However, if left untreated, it can lead to more serious health problems.  Some people have no symptoms (are asymptomatic), especially early in the infection.  This condition is treated with antibiotic medicines.  Using latex condoms correctly every time you have sex can help prevent  chlamydia.  +++++++++++++++++++++++++++++++++  Human Papillomavirus  Human papillomavirus (HPV) is a common virus that spreads easily from person to person through skin-to-skin or sexual contact. There are many types of HPV. It often does not cause symptoms. However, depending upon the type, it may sometimes cause warts in the genitals (genital or mucosal HPV), or on the hands or feet (cutaneous or nonmucosal HPV). It is possible to be infected for a long time and pass HPV to others without knowing it. In most cases of HPV, a person will recover from the virus without treatment within 2 years of being infected. However, in some cases, HPV infection can last longer and lead to serious health problems. Certain types of genital or mucosal HPV are considered high-risk and may cause cancers, including cancer of the lower part of the uterus (cervix), vagina, outer male genital area (vulva), penis, anus, and rectum as well as cancers of the oral cavity, such as the throat, tongue, and tonsils.  What are the causes?  HPV is caused by a virus that spreads from person to person through contact. This includes genital HPV, which spreads through oral, vaginal, or anal sex.  What increases the risk?  You may be more likely to develop this condition if you have or have had:  Direct contact with a person with HPV.  Unprotected oral, vaginal, or anal sex.  Several sex partners.  A sex partner who has other sex partners.  Another sexually transmitted infection (STI).  A weak disease-fighting system (immune system).  Areas of damaged or non-intact skin.   What are the signs or symptoms?  Most people who have HPV do not have any symptoms. If symptoms are present, they may include:  Wart-like lesions in the throat (from having oral sex).  Warts on the infected skin.  Genital warts that may itch, burn, bleed, or be painful during sex.     How is this diagnosed?  If you have wart-like lumps  in the anal area or throat, warts along the soles of your feet or palms of your hand, or if genital warts are present, your health care provider can usually diagnose HPV with a physical exam. Genital warts are easily seen. In females, tests may be used to diagnose genital HPV, including:  A Pap test. A Pap test takes a sample of cells from the cervix to check for cancer and HPV infection.  An HPV test. This is similar to a Pap test and involves taking a sample of cells from the cervix. It may be done at the same  time as a Pap test.  Using a scope to view the cervix (colposcopy). This may be done if a pelvic exam or Pap test is abnormal. A sample of tissue may be removed for testing (biopsy) during the colposcopy. Currently, there is no test to detect genital HPV in males.  How is this treated?  There is no treatment for the virus itself. However, there are treatments for the health problems and symptoms HPV can cause. Treatment for HPV may include:  Medicines in a cream, lotion, liquid, or gel form. These medicines may be injected into the warts, or applied directly to them.  Use of a probe to apply extreme cold (cryotherapy) to the warts.  Application of an intense beam of light (laser treatment) on the warts.  Use of a probe to apply extreme heat (electrocautery) to the warts.  Surgery to remove the warts. Your health care provider will monitor you closely after you are treated. HPV can come back and you may need treatment again.  Follow these instructions at home:  Medicines   Take over-the-counter and prescription medicines only as told by your health care provider. This includes creams for itching or irritation.    Do not treat genital or anal warts with medicines used for treating hand warts.   General instructions   Do not touch or scratch the warts.  Do not have sex while you are being treated.  Do not douche or use tampons during treatment (for women).  Tell  your sex partner about your infection. He or she may also need to be treated.  If you become pregnant, tell your health care provider that you have HPV. Your health care provider will monitor you closely during pregnancy to make sure you and your baby are safe.  Keep all follow-up visits. This is important.   How is this prevented?   Talk with your health care provider about getting an HPV vaccine, which can prevent some HPV infections and related cancers. It will not work if you already have HPV and is not recommended for pregnant women. You may need 2-3 doses of the vaccine, depending on your age.  After treatment, use condoms during sex to prevent future infections.  Have only one sex partner.  Have a sex partner who does not have other sex partners.  Get regular Pap tests as directed by your health care provider.   Contact a health care provider if:   The treated skin becomes red, swollen, or painful.  You have a fever.  You feel generally ill.  You feel lumps or pimples in and around your genital or anal area.  You develop bleeding of the vagina or the treatment area.  You have painful sex.   Summary   Human papillomavirus (HPV) is a common virus that spreads easily from person to person and ishighly contagious.  Many people carrying HPV do not have any symptoms.  Many forms of HPV can be prevented with vaccination.  There is no treatment for the virus itself. However, there are treatments for the health problems and symptoms HPV can cause.   https://www.merckmanuals.com/professional/infectious-diseases/herpesviruses/genital-herpes">   Genital Herpes  Genital herpes is a common sexually transmitted infection (STI) that is caused by a virus. The virus spreads from person to person through sexual contact. The infection can cause itching, blisters, and sores around the genitals or rectum. Symptoms may last for several days and then go away. This is called an  outbreak. The virus remains in the body,  however, so more outbreaks may happen in the future. The time between outbreaks varies and can be from months to years. Genital herpes can affect anyone. It is particularly concerning for pregnant women because the virus can be passed to the baby during delivery and can cause serious problems. Genital herpes is also a concern for people who have a weak disease-fighting system (immune system).  What are the causes?  This condition is caused by the human herpesvirus, also called herpes simplex virus, type 1 or type 2 (HSV-1 or HSV-2). The virus may spread through:  Sexual contact with an infected person, including vaginal, anal, and oral sex.  Contact with fluid from a herpes sore.  The skin. This means that you can get herpes from an infected partner even if there are no blisters or sores present. Your partner may not know that he or she is infected.   What increases the risk?  You are more likely to develop this condition if:  You have sex with many partners.  You do not use latex condoms during sex.   What are the signs or symptoms?  Most people do not have symptoms (are asymptomatic), or they have mild symptoms that may be mistaken for other skin problems. Symptoms may include:  Small, red bumps near the genitals, rectum, or mouth. These bumps turn into blisters and then sores.  Flu-like symptoms, including: ? Fever. ? Body aches. ? Swollen lymph nodes. ? Headache.  Painful urination.  Pain and itching in the genital area or rectal area.  Vaginal discharge.  Tingling or shooting pain in the legs and buttocks. Generally, symptoms are more severe and last longer during the first (primary) outbreak. Flu-like symptoms are also more common during the primary outbreak.    How is this diagnosed?  This condition may be diagnosed based on:  A physical exam.  Your medical history.  Blood tests.  A test of a fluid sample (culture)  from an open sore.   How is this treated?  There is no cure for this condition, but treatment with antiviral medicines that are taken by mouth (orally) can do the following:  Speed up healing and relieve symptoms.  Help to reduce the spread of the virus to sexual partners.  Limit the chance of future outbreaks, or make future outbreaks shorter.  Lessen symptoms of future outbreaks. Your health care provider may also recommend pain relief medicines, such as aspirin or ibuprofen.  Follow these instructions at home:  If you have an outbreak:   Keep the affected areas dry and clean.  Avoid rubbing or touching blisters and sores. If you do touch blisters or sores: ? Wash your hands thoroughly with soap and water. ?  ? Do not touch your eyes afterward. ?   To help relieve pain or itching, you may take the following actions as told by your health care provider: ? Apply a cold, wet cloth (cold compress) to affected areas 4-6 times a day. ? Apply a substance that protects your skin and reduces bleeding (astringent). ? Apply a gel that helps relieve pain around sores (lidocaine gel). ? Take a warm, shallow bath that cleans the genital area (sitz bath). ?  Sexual activity   Do not have sexual contact during active outbreaks.    Practice safe sex. Herpes can spread even if your partner does not have blisters or sores. Latex condoms and male condoms may help prevent the spread of the herpes virus.   General instructions  Take over-the-counter and prescription medicines only as told by your health care provider.  Keep all follow-up visits as told by your health care provider. This is important.   How is this prevented?   Use condoms. Although you can get genital herpes during sexual contact even with the use of a condom, a condom can provide some protection.  Avoid having multiple sexual partners.  Talk with your sexual partner about any symptoms either of you may  have. Also, talk with your partner about any history of STIs.  Get tested for STIs before you have sex. Ask your partner to do the same.    Do not have sexual contact if you have active symptoms of genital herpes.   Contact a health care provider if:   Your symptoms are not improving with medicine.  Your symptoms return, or you have new symptoms.  You have a fever.  You have abdominal pain.  You have redness, swelling, or pain in your eye.  You notice new sores on other parts of your body.  Summary   Genital herpes is a common sexually transmitted infection (STI) that is caused by the herpes simplex virus, type 1 or type 2 (HSV-1 or HSV-2).  These viruses are most often spread through sexual contact with an infected person.  You are more likely to develop this condition if you have sex with many partners or you do not use condoms during sex.  Most people do not have symptoms (are asymptomatic) or have mild symptoms that may be mistaken for other skin problems. Symptoms occur as outbreaks that may happen months or years apart.  There is no cure for this condition, but treatment with oral antiviral medicines can reduce symptoms, reduce the chance of spreading the virus to a partner, prevent future outbreaks, or shorten future outbreaks.  ++++++++++++++++++++++++++++++++  HIV Infection and AIDS  HIV (human immunodeficiency virus) infection is a permanent (chronic) viral infection. HIV kills white blood cells that are called CD4 cells. These cells help to control the body's defense system (immune system) and fight infection. If a person does not have enough CD4 cells, he or she can develop infections, cancers, and other health problems.  What are the causes?  This virus is passed from person to person:  Through sex.  Through contact with infected blood or other bodily fluids. An infected mother can also pass the virus to her baby during pregnancy, childbirth, or  breastfeeding.  What increases the risk?  This condition is more likely to develop in people who:  Have unprotected sex.  Share needles or other drug equipment.   What are the signs or symptoms?  Symptoms of this condition usually develop in phases:  Asymptomatic phase  You may not feel sick, or you may only feel sick some of the time. Symptoms may include:  Low-grade fever.  Headaches.  Sore throat.  Rash.  Fatigue.  Nausea, vomiting, or diarrhea.  Night sweats.   Early symptomatic phase  You may notice:  Your early symptoms getting worse or happening more often.  Oral, vaginal, or rectal sores that are caused by infections.  Problems that are related to inflammation, such as joint pain.   Symptomatic phase: AIDS, or acquired immunodeficiency syndrome   Your immune system no longer protects you from infections and other health problems. You may get infections you would not normally get if your immune system was healthy and working properly (opportunistic diseases). Problems that are caused by opportunistic diseases include:  Coughing.  Trouble breathing.  Diarrhea.  Skin sores.  Trouble swallowing.  High fevers.  Blurred vision.  Stiff neck.  Mental confusion. You may also begin to notice:  Weight loss.  Tingling or pain in your hands and feet.  Mouth sores or tooth pain.  Severe fatigue.   How is this diagnosed?  This condition is diagnosed with:  A screening test to check blood for a protein (antibody) that is produced only when the body is fighting HIV.  A blood test to confirm the presence of HIV.   How is this treated?  There is no cure for this condition, but treatment can help to keep HIV from getting worse.  You will be given medicines that may slow down the rate at which HIV multiplies in your body (antiretroviral therapy, or ART). ART may: ? Keep your immune system as healthy as possible and help it work  better. ? Decrease the amount of HIV in your body. ? Reduce the risk of problems caused by HIV. ? Prolong your life. ? Improve the quality of your life. ? Help prevent passing HIV to someone.  You will need to have routine lab tests performed to monitor your treatment and immune system.   Follow these instructions at home:  Medicines   Take over-the-counter and prescription medicines only as told by your health care provider.  If you were prescribed an antibiotic medicine, take it as told by your health care provider  . Do not stop taking the antibiotic even if you start to feel better.   Lifestyle   Do not use any products that contain alcohol, nicotine, tobacco, or recreational drugs. These can cause further damage to your immune system. They can also cause problems with your liver, lungs, and heart. If you need help quitting, ask your health care provider.  Do not share needles or other equipment used for injecting, smoking, or snorting drugs.  Protect yourself from other STIs (sexually transmitted infections) by using condoms when you have sex. This includes vaginal, oral, and anal sex.  Eat in a healthy way, get enough sleep, and exercise.     General instructions   Tell your sexual partners that you have HIV. Encourage them to get tested.  Keep your vaccinations up to date. Make sure that you get all recommended vaccines, including vaccines for hepatitis A, hepatitis B, measles, and influenza.  See your dentist regularly. Brush and floss your teeth every day.  See a counselor or a Child psychotherapist to help you solve problems and find any services that you need.  Get support from your family and friends.  Keep all follow-up visits. This is important. You will need to have routine blood tests every 3-6 months to monitor your health.     How is this prevented?  To prevent the spread of HIV:  Talk with your health care provider about protecting your sexual partners  from HIV. Your health care provider may encourage your partner to take medicines to decrease the risk of getting HIV (pre-exposure prophylaxis, or PrEP).  Use a condom every time you have sex. This includes vaginal, oral, and anal sex. ? The condom should be in place from the beginning of the sexual activity to the end. ? Use only latex or polyurethane condoms and water-based lubricants. ? Wearing a condom reduces your risk of spreading HIV.  Avoid alcohol and recreational drugs that affect your judgment. They may make you forget to use a condom or may increase  your chances of participating in high-risk sex.    Do not share equipment that is used to take drugs, such as needles, syringes, cookers, tourniquets, pipes, or straws. If you share equipment, clean it before and after you use it.     Contact a health care provider if you have:   Lost a lot of weight.  Extreme fatigue.  Trouble swallowing.  Vomiting or diarrhea that does not get better.  Muscle pain or joint pain.  Any problems related to your medicines.   Get help right away if you have:   A rash that causes your skin to peel.  Blisters inside your mouth.  Pain in your abdomen.  Eye redness or swelling around your eyes.  A high fever and chills.  Shortness of breath.  A cough that is dry (nonproductive) or wet (productive).  Vision problems, such as blind spots, flashing lights, or decreased or blurred vision.  A persistent headache, confusion, or changes in the way that you think, feel, or behave (altered mental status).   Summary   HIV kills white blood cells called CD4 cells that help to control the body's defense system (immune system) and fight infection.  Symptoms of this condition usually develop in phases. In the asymptomatic phase, you may not feel sick, or you may only feel sick some of the time.  Treatment will include medicines to slow down the rate at which HIV multiplies in your body  (antiretroviral therapy, or ART).  +++++++++++++++++++++++++++++++++  Gonorrhea  Gonorrhea is an STI (sexually transmitted infection) that can infect both men and women. If left untreated, this infection can:  Damage the male or male reproductive organs.  Cause women and men to be unable to have children (infertility).  Harm an unborn baby if an infected woman is pregnant. It is important to get treatment for gonorrhea as soon as possible. All of your sex partners may also need to be treated for the infection.  What are the causes?  This condition is caused by bacteria called Neisseria gonorrhoeae. The infection is spread from person to person through sexual contact, including oral, anal, and vaginal sex. A newborn can get the infection from his or her mother during birth.  What increases the risk?  The following factors may make you more likely to develop this condition:  Being a woman who is younger than age 40 and who is sexually active.  Being a man who is gay or bisexual and who has sex with men.  Having a new sex partner or having multiple partners.  Having a sex partner who has an STI.  Not using condoms correctly or not using condoms every time you have sex.  Having a history of STIs.  Exchanging sex for money or drugs.   What are the signs or symptoms?  When symptoms occur, they may be different for women and men. Symptoms may include:  Abnormal discharge from the penis or vagina. The discharge may be cloudy, thick, or yellow-green in color.  Pain or burning when you urinate.  Irritation, pain, bleeding, or discharge from the rectum. This may occur if the infection was spread by anal sex.  Sore throat or swollen lymph nodes in the neck. This may occur if the infection was spread by oral sex.  Pain or swelling in the testicles, in men.  Bleeding between menstrual periods, in women. In some cases, there are no symptoms.  How is this diagnosed?  This  condition is diagnosed based on:  A physical exam.  A sample of discharge that is looked at under a microscope to find any bacteria. The discharge may be taken from the penis, vagina, throat, or rectum.  Urine tests. Not all test results will be available during your visit.  How is this treated?  This condition is treated with antibiotic medicines. It is important to start treatment as soon as possible. Early treatment may prevent some problems from developing. You should not have sex during treatment. All types of sexual activity should be avoided for at least 7 days after treatment is complete and until your sex partner or partners have been treated.  Follow these instructions at home:   Take over-the-counter and prescription medicines as told by your health care provider. Finish all antibiotic medicine even when you start to feel better.    Do not have sex during treatment.  Do not have sex until at least 7 days after you and your partner or partners have finished treatment and your health care provider says it is okay.    It is up to you to get your test results. Ask your health care provider, or the department that is doing the test, when your results will be ready.  If you get a positive result on your gonorrhea test, tell your recent sex partners. These include any partners for oral, anal, or vaginal sex. They need to be checked for gonorrhea even if they do not have symptoms. They may need treatment, even if they get negative results on their gonorrhea tests.  Drink enough fluid to keep your urine pale yellow.  Keep all follow-up visits as told by your health care provider. This is important.   How is this prevented?   Use latex condoms correctly every time you have sex.  Ask if your sex partner or partners have been tested for STIs and had negative results.  Avoid having multiple sex partners.  Get regular health screenings to check for STIs.   Contact a health  care provider if:   Your symptoms do not get better after a few days of taking antibiotics.  Your symptoms get worse.  You develop new symptoms, including: ? Eye irritation. ? Joint pain or swelling. ? Unusual rashes.  You have a fever.   Summary   Gonorrhea is an STI (sexually transmitted infection) that can infect both men and women.  This infection is spread from person to person through sexual contact, including oral, anal, and vaginal sex. A newborn can get the infection from his or her mother during birth.  Symptoms include abnormal discharge, pain or burning while urinating, or pain in the rectum.  This condition is treated with antibiotic medicines.     Do not have sex until at least 7 days after both you and any sex partners have completed antibiotic treatment.    Tell your health care provider if your symptoms get worse or you have new symptoms. This information is not intended to replace advice given to you by your health care provider. Make sure you discuss any questions you have with your health care provider.  ================================  Syphilis  Syphilis is an infection that can spread through sexual contact. The infection can cause serious complications, so it is important to get treatment right away. There are four stages of syphilis:  Primary stage. During this stage sores may form where the disease entered your body.  Secondary stage. During this stage skin rashes and lesions will form.  Latent stage. During  this stage there are no symptoms, but the infection may still be contagious.  Tertiary stage. This stage happens 10-30 years after the infection starts. During this stage, the disease damages organs and can lead to death. Most people do not develop this stage of syphilis.   What are the causes?  This condition is caused by bacteria called Treponema pallidum. The condition can spread during sexual activity, such as during oral, anal,  or vaginal sex. It can also be spread from mother to fetus during pregnancy.  What increases the risk?  You are more likely to develop this condition if:  You do not use a condom.  You have or had another sexually transmitted infection (STI).  You have multiple sex partners.  You use illegal drugs through an IV.   What are the signs or symptoms?  Symptoms of this condition depend on the stage of the disease.   Primary stage    Painless sores (chancres) in and around the genital organs, mouth, or hands. The sores are usually firm and round.   Secondary stage   A rash or sores. The rash usually does not itch.  A fever.  A headache.  A sore throat.  Swollen lymph nodes.  New sores in the mouth or on the genitals.  A feeling of being ill.  Pain in the joints.  Patchy hair loss.  Weight loss.  Fatigue.   Latent stage  There are no symptoms during this stage.  Tertiary stage   Dementia.  Personality and mood changes.  Difficulty walking and coordinating movements.  Muscle weakness or paralysis.  Problems with coordination.  Heart failure.  Trouble breathing.  Fainting.  Soft, rubbery growths on the skin, bones, or liver (gummas).  Sudden "lightning" pains, numbness, or tingling.  Vision changes.  Hearing changes.  Trouble controlling your urine and bowel movements.     How is this treated?  This condition can be cured with antibiotic medicine. During the first day of treatment, the medicine may cause you to experience fever, chills, headache, nausea, or aching all over your body. This is normal and should go away within 24 hours.  Follow these instructions at home:  Medicines   Take over-the-counter and prescription medicines only as told by your health care provider.  Take your antibiotic medicine as told by your health care provider.   Do not stop taking the antibiotic even if you start to feel better. Incomplete  treatment will put you at risk for continued infection and could be life threatening.     General instructions    Do not have sex until your treatment is completed, or as directed by your health care provider.    Tell your recent sexual partners that you were diagnosed with syphilis. It is important that they get treatment, even if they do not have symptoms.  Keep all follow-up visits as told by your health care provider. This is important.   How is this prevented?   Use latex condoms correctly whenever you have sex.  Before you have sex, ask your partner if he or she has been tested for STIs. Ask about the test results.  Avoid having multiple sexual partners.   Contact a health care provider if:   You continue to have any of the following symptoms 24 hours after beginning treatment: ? Fever. ? Chills. ? Headache. ? Nausea. ? Aching all over your body. Your symptoms do not improve, even with treatment.  Get help right away if:  You have severe chest pain.  You have trouble walking or coordinating movements.  You are confused.  You lose vision or hearing.  You have numbness in your arms or legs.  You have a seizure.  You faint.  You have a severe headache that does not go away with medicine.   Summary   Syphilis is an infection that can spread through sexual contact.  This condition can cause serious complications, so it is best to get treatment right away. The condition can be cured with antibiotic medicine.  This condition can also be spread from mother to fetus during pregnancy.  Take your antibiotic medicine as told by your health care provider.  Tell your recent sexual partners that you were diagnosed with syphilis. It is important that they get treatment, even if they do not have symptoms. This information is not intended to replace advice given to you by your health care provider. Make sure you discuss any questions you have with your  health care provider. +++++++++++++++++++++++++++++++++++++

## 2020-11-02 ENCOUNTER — Other Ambulatory Visit: Payer: Self-pay | Admitting: Internal Medicine

## 2020-11-02 ENCOUNTER — Encounter: Payer: Self-pay | Admitting: Internal Medicine

## 2020-11-02 LAB — C. TRACHOMATIS/N. GONORRHOEAE RNA

## 2020-11-02 LAB — HIV ANTIBODY (ROUTINE TESTING W REFLEX): HIV 1&2 Ab, 4th Generation: NONREACTIVE

## 2020-11-02 LAB — HSV(HERPES SIMPLEX VRS) I + II AB-IGG
HAV 1 IGG,TYPE SPECIFIC AB: <0.9 index
HSV 2 IGG,TYPE SPECIFIC AB: <0.9 index

## 2020-11-02 LAB — RPR: RPR Ser Ql: NONREACTIVE

## 2020-11-02 MED ORDER — DOXYCYCLINE HYCLATE 100 MG PO CAPS
ORAL_CAPSULE | ORAL | 1 refills | Status: AC
Start: 2020-11-02 — End: ?

## 2020-11-02 NOTE — Progress Notes (Signed)
=============================================================== -   Test results slightly outside the reference range are not unusual. If there is anything important, I will review this with you,  otherwise it is considered normal test values.  If you have further questions,  please do not hesitate to contact me at the office or via My Chart.  ===============================================================  -  Tests for RPR ( Syphillis ), HIV (AIDS ), HSV ( Herpes ) - All Negative & OK    - HPV - or "Genital warts which is the most common STD in [place] is still pending.        ( HPV is also known to be the cause of                                           Cervical Cancer in Females & Penile cancer in men) ===============================================================  - The Gonorrhea & Chlamydia tests were cancelled due to problems with                                                    collection /transport media & are not repeated, but   - Due to history of (+) Chlamydia culture in exposure,                                                                            Rx sent in to treat same with a refill ===============================================================  - The best Prevention / Protection is abstinence or "barrier" as condoms ===============================================================

## 2020-12-20 ENCOUNTER — Encounter: Payer: Self-pay | Admitting: Internal Medicine

## 2020-12-20 DIAGNOSIS — R5381 Other malaise: Secondary | ICD-10-CM | POA: Diagnosis not present

## 2020-12-20 DIAGNOSIS — R051 Acute cough: Secondary | ICD-10-CM | POA: Diagnosis not present

## 2020-12-20 DIAGNOSIS — J029 Acute pharyngitis, unspecified: Secondary | ICD-10-CM | POA: Diagnosis not present

## 2020-12-20 DIAGNOSIS — Z20822 Contact with and (suspected) exposure to covid-19: Secondary | ICD-10-CM | POA: Diagnosis not present

## 2021-01-10 ENCOUNTER — Other Ambulatory Visit (HOSPITAL_COMMUNITY): Payer: Self-pay

## 2021-01-10 ENCOUNTER — Other Ambulatory Visit (HOSPITAL_COMMUNITY)
Admission: RE | Admit: 2021-01-10 | Discharge: 2021-01-10 | Disposition: A | Discharge status: Home / Self Care | Payer: BC Managed Care – PPO | Source: Ambulatory Visit | Attending: Infectious Disease | Admitting: Infectious Disease

## 2021-01-10 ENCOUNTER — Telehealth: Payer: Self-pay

## 2021-01-10 ENCOUNTER — Other Ambulatory Visit: Payer: Self-pay

## 2021-01-10 ENCOUNTER — Ambulatory Visit (INDEPENDENT_AMBULATORY_CARE_PROVIDER_SITE_OTHER): Payer: BC Managed Care – PPO | Admitting: Pharmacist

## 2021-01-10 DIAGNOSIS — Z113 Encounter for screening for infections with a predominantly sexual mode of transmission: Secondary | ICD-10-CM

## 2021-01-10 DIAGNOSIS — Z79899 Other long term (current) drug therapy: Secondary | ICD-10-CM | POA: Diagnosis not present

## 2021-01-10 NOTE — Telephone Encounter (Signed)
[facility] Patient Product/process development scientist completed.    The patient is insured through [employer].  Descovy $200.00 Truvada (generic) $0.00 Apretude is not covered under pharmacy benefits  We will continue to follow to see if copay assistance is needed.  Clearance Coots, CPhT Specialty Pharmacy Patient [facility] Phone: [phone] Fax:  [phone]

## 2021-01-10 NOTE — Telephone Encounter (Signed)
[facility] Patient Advocate Encounter °  °Was successful in obtaining a [employer] copay card for Descovy.  This copay card will make the patients copay 0.00. ° °I have spoken with the patient.   ° °The billing information is as follows and has been shared with [facility]. ° ° ° ° ° ° °Zamantha Strebel, CPhT °Specialty Pharmacy Patient Advocate °[facility] °Phone: [phone] °Fax:  [phone]  °

## 2021-01-10 NOTE — Progress Notes (Signed)
Date:  01/10/2021   HPI: Tyreque Finken is a 35 y.o. male who presents to the [facility] pharmacy clinic to discuss and initiate PrEP.  Insured   [x]    Uninsured  []    Patient Active Problem List   Diagnosis Date Noted   Screening cholesterol level 10/06/2017    Patient's Medications  New Prescriptions   No medications on file  Previous Medications   ALPRAZOLAM (XANAX) 0.5 MG TABLET    Take     1/2 to 1 tablet      2 to 3 x /day      if needed for Anxiety Attack or Sleep   CHOLECALCIFEROL (VITAMIN D PO)    Take by mouth. Takes occasionally   CITALOPRAM (CELEXA) 20 MG TABLET    Take      1 tablet      Daily       for Mood   DOXYCYCLINE (VIBRAMYCIN) 100 MG CAPSULE    Take  2 capsules  Immediately with Food, then  1 capsule  2 x /day  with Meals  for 10 days  Modified Medications   No medications on file  Discontinued Medications   No medications on file    Allergies: Allergies  Allergen Reactions   Cephalosporins Rash    Past Medical History: Past Medical History:  Diagnosis Date   Asthma    Kidney stones    Orchitis, left 2013   Dr 12/06/2017   Vitamin D deficiency     Social History: Social History   Socioeconomic History   Marital status: Single    Spouse name: Not on file   Number of children: 0   Years of education: Not on file   Highest education level: Not on file  Occupational History   Occupation: account analyst  Tobacco Use   Smoking status: Never   Smokeless tobacco: Never  Substance and Sexual Activity   Alcohol use: Yes    Comment: rarely   Drug use: No   Sexual activity: Not on file  Other Topics Concern   Not on file  Social History Narrative   Not on file   Social Determinants of Health   Financial Resource Strain: Not on file  Food Insecurity: Not on file  Transportation Needs: Not on file  Physical Activity: Not on file  Stress: Not on file  Social Connections: Not on file    No flowsheet data found.  Labs:  SCr: Lab Results   Component Value Date   CREATININE 1.05 08/15/2020   CREATININE 1.02 11/15/2018   CREATININE 0.93 03/18/2018   CREATININE 1.00 10/06/2017   CREATININE 1.11 12/25/2016   HIV Lab Results  Component Value Date   HIV NON-REACTIVE 11/01/2020   HIV NON-REACTIVE 09/06/2018   HIV NON-REACTIVE 06/23/2017   Hepatitis B No results found for: HEPBSAB, HEPBSAG, HEPBCAB Hepatitis C No results found for: HEPCAB, HCVRNAPCRQN Hepatitis A No results found for: HAV RPR and STI Lab Results  Component Value Date   LABRPR NON-REACTIVE 11/01/2020   LABRPR NON-REACTIVE 09/06/2018   LABRPR NON-REACTIVE 06/23/2017    No flowsheet data found.  Assessment: Alecxander is here today to discuss and initiate HIV PrEP. He was recently tested in March at his PCP's office but was not offered PrEP at that time. He knows quite a good bit about PrEP at baseline including all medications available. He is insured through [employer].  He has had 2 male partners in the last 3 months with about 75% condom  use. He is a versatile partner that engages in oral sex as well. He is not aware if any of his partners are HIV positive. He has never used IV drugs, stimulants, or PEP. He was exposed to chlamydia earlier this year and was preemptively treated by his PCP. His test was somehow cancelled. Otherwise, he has never tested positive for a STI.  Discussed all PrEP options including Truvada, Descovy, and Apretude. He is interested in oral options for now. His insurance will pay for Descovy, so will prescribe that if he is HIV negative.   Counseled patient that Descovy is a one pill once daily regimen with or without food that can prevent HIV. Discussed the importance of taking the medication daily to provide protection and decreased adherence is associated with decreased efficacy. Also discussed how Descovy works to prevent HIV but not other STDs and encouraged the use of condoms. Counseled on what to do if dose is missed, if closer to  missed dose take immediately, if closer to next dose then skip and resume normal schedule.  Counseled patient that Descovy is normally well tolerated, however some patients experience a "start up syndrome" with headache, nausea, abdominal discomfort but that those should resolve soon after starting.  Advised that any nausea can be mitigated by taking it with food. I reviewed patient medications and found no drug interactions. Discussed how our PrEP process works here at the clinic including follow ups and lab monitoring every 3 months.  He would like to pick up his prescription at [facility].  I will do a virtual visit in one month to see how he is tolerating his medication and see him in clinic again in 3 months.  Plan: - HIV antibody, BMET, RPR, urine/oral/rectal gonorrhea/chlamydia cytology, hepatitis B surface antigen, hepatitis B surface antibody, hepatitis A antibody, hepatitis C antibody today - Descovy x 3 months if HIV negative - F/u in 3 months  Seleena Reimers L. Delainey Winstanley, PharmD, BCIDP, AAHIVP, CPP Clinical Pharmacist Practitioner Infectious Diseases Clinical Pharmacist [facility] 01/10/2021, 2:52 PM

## 2021-01-11 ENCOUNTER — Encounter: Payer: Self-pay | Admitting: Pharmacist

## 2021-01-11 ENCOUNTER — Other Ambulatory Visit (HOSPITAL_COMMUNITY): Payer: Self-pay

## 2021-01-11 ENCOUNTER — Other Ambulatory Visit: Payer: Self-pay | Admitting: Pharmacist

## 2021-01-11 DIAGNOSIS — Z79899 Other long term (current) drug therapy: Secondary | ICD-10-CM

## 2021-01-11 LAB — BASIC METABOLIC PANEL
BUN: 19 mg/dL (ref 7–25)
CO2: 26 mmol/L (ref 20–32)
Calcium: 10 mg/dL (ref 8.6–10.3)
Chloride: 104 mmol/L (ref 98–110)
Creat: 1.05 mg/dL (ref 0.60–1.35)
Glucose, Bld: 98 mg/dL (ref 65–99)
Potassium: 4.5 mmol/L (ref 3.5–5.3)
Sodium: 139 mmol/L (ref 135–146)

## 2021-01-11 LAB — URINE CYTOLOGY ANCILLARY ONLY
Chlamydia: NEGATIVE
Comment: NEGATIVE
Comment: NORMAL
Neisseria Gonorrhea: NEGATIVE

## 2021-01-11 LAB — CYTOLOGY, (ORAL, ANAL, URETHRAL) ANCILLARY ONLY
Chlamydia: NEGATIVE
Chlamydia: NEGATIVE
Comment: NEGATIVE
Comment: NEGATIVE
Comment: NORMAL
Comment: NORMAL
Neisseria Gonorrhea: NEGATIVE
Neisseria Gonorrhea: NEGATIVE

## 2021-01-11 LAB — HEPATITIS B SURFACE ANTIBODY,QUALITATIVE: Hep B S Ab: REACTIVE — AB

## 2021-01-11 LAB — HEPATITIS C ANTIBODY
Hepatitis C Ab: NONREACTIVE
SIGNAL TO CUT-OFF: 0.01 (ref <1.00)

## 2021-01-11 LAB — HEPATITIS A ANTIBODY, TOTAL: Hepatitis A AB,Total: NONREACTIVE

## 2021-01-11 LAB — HIV ANTIBODY (ROUTINE TESTING W REFLEX): HIV 1&2 Ab, 4th Generation: NONREACTIVE

## 2021-01-11 LAB — RPR: RPR Ser Ql: NONREACTIVE

## 2021-01-11 LAB — HEPATITIS B SURFACE ANTIGEN: Hepatitis B Surface Ag: NONREACTIVE

## 2021-01-11 MED ORDER — DESCOVY 200-25 MG PO TABS
1.0000 | ORAL_TABLET | Freq: Every day | ORAL | 2 refills | Status: DC
  Filled 2021-01-11: qty 30, 30d supply, fill #0
  Filled 2021-01-30 – 2021-02-16 (×2): qty 30, 30d supply, fill #1
  Filled 2021-03-07: qty 30, 30d supply, fill #2

## 2021-01-11 NOTE — Progress Notes (Signed)
HIV test is negative. Sending 3 months of Descovy to [facility]. Patient will pick up today.

## 2021-01-24 DIAGNOSIS — Z9189 Other specified personal risk factors, not elsewhere classified: Secondary | ICD-10-CM | POA: Diagnosis not present

## 2021-01-24 DIAGNOSIS — Z0000 Encounter for general adult medical examination without abnormal findings: Secondary | ICD-10-CM | POA: Diagnosis not present

## 2021-01-30 ENCOUNTER — Other Ambulatory Visit (HOSPITAL_COMMUNITY): Payer: Self-pay

## 2021-02-07 ENCOUNTER — Other Ambulatory Visit (HOSPITAL_COMMUNITY): Payer: Self-pay

## 2021-02-12 ENCOUNTER — Ambulatory Visit (INDEPENDENT_AMBULATORY_CARE_PROVIDER_SITE_OTHER): Payer: BC Managed Care – PPO | Admitting: Pharmacist

## 2021-02-12 ENCOUNTER — Other Ambulatory Visit: Payer: Self-pay

## 2021-02-12 DIAGNOSIS — Z79899 Other long term (current) drug therapy: Secondary | ICD-10-CM | POA: Diagnosis not present

## 2021-02-12 NOTE — Progress Notes (Signed)
Date:  02/12/2021   Virtual Visit via Telephone Note  I connected with Alan Beck on 02/12/21 at 1:45 PM EDT by telephone and verified that I am speaking with the correct person using two identifiers.  Location: Patient: Home Provider: Office   I discussed the limitations, risks, security and privacy concerns of performing an evaluation and management service by telephone and the availability of in person appointments. I also discussed with the patient that there may be a patient responsible charge related to this service. The patient expressed understanding and agreed to proceed.  HPI: Alan Beck is a 35 y.o. male who presents to the [facility] pharmacy clinic for HIV PrEP follow-up.  Insured   [x]    Uninsured  []    Patient Active Problem List   Diagnosis Date Noted   Screening cholesterol level 10/06/2017    Patient's Medications  New Prescriptions   No medications on file  Previous Medications   ALPRAZOLAM (XANAX) 0.5 MG TABLET    Take     1/2 to 1 tablet      2 to 3 x /day      if needed for Anxiety Attack or Sleep   CHOLECALCIFEROL (VITAMIN D PO)    Take by mouth. Takes occasionally   CITALOPRAM (CELEXA) 20 MG TABLET    Take      1 tablet      Daily       for Mood   DOXYCYCLINE (VIBRAMYCIN) 100 MG CAPSULE    Take  2 capsules  Immediately with Food, then  1 capsule  2 x /day  with Meals  for 10 days   EMTRICITABINE-TENOFOVIR AF (DESCOVY) 200-25 MG TABLET    Take 1 tablet by mouth daily.  Modified Medications   No medications on file  Discontinued Medications   No medications on file    Allergies: Allergies  Allergen Reactions   Cephalosporins Rash    Past Medical History: Past Medical History:  Diagnosis Date   Asthma    Kidney stones    Orchitis, left 2013   Dr 12/06/2017   Vitamin D deficiency     Social History: Social History   Socioeconomic History   Marital status: Single    Spouse name: Not on file   Number of children: 0   Years of education:  Not on file   Highest education level: Not on file  Occupational History   Occupation: account analyst  Tobacco Use   Smoking status: Never   Smokeless tobacco: Never  Substance and Sexual Activity   Alcohol use: Yes    Comment: rarely   Drug use: No   Sexual activity: Not on file  Other Topics Concern   Not on file  Social History Narrative   Not on file   Social Determinants of Health   Financial Resource Strain: Not on file  Food Insecurity: Not on file  Transportation Needs: Not on file  Physical Activity: Not on file  Stress: Not on file  Social Connections: Not on file    CH[facility] HIV PREP FLOWSHEET RESULTS 01/10/2021  Insurance Status Insured  Gender at birth Male  Gender identity cis-Male  Risk for HIV Condomless vaginal or anal intercourse  Sex Partners Men only  # sex partners past 3-6 mos 2  Sex activity preferences Insertive and receptive;Oral  Condom use Yes  % condom use 75  Treated for STI? No  HIV symptoms? None  PrEP Eligibility Yes    Labs:  SCr: Lab  Results  Component Value Date   CREATININE 1.05 01/10/2021   CREATININE 1.05 08/15/2020   CREATININE 1.02 11/15/2018   CREATININE 0.93 03/18/2018   CREATININE 1.00 10/06/2017   HIV Lab Results  Component Value Date   HIV NON-REACTIVE 01/10/2021   HIV NON-REACTIVE 11/01/2020   HIV NON-REACTIVE 09/06/2018   HIV NON-REACTIVE 06/23/2017   Hepatitis B Lab Results  Component Value Date   HEPBSAB REACTIVE (A) 01/10/2021   HEPBSAG NON-REACTIVE 01/10/2021   Hepatitis C Lab Results  Component Value Date   HEPCAB NON-REACTIVE 01/10/2021   Hepatitis A Lab Results  Component Value Date   HAV NON-REACTIVE 01/10/2021   RPR and STI Lab Results  Component Value Date   LABRPR NON-REACTIVE 01/10/2021   LABRPR NON-REACTIVE 11/01/2020   LABRPR NON-REACTIVE 09/06/2018   LABRPR NON-REACTIVE 06/23/2017    STI Results GC CT  01/10/2021 Negative Negative  01/10/2021 Negative Negative  01/10/2021  Negative Negative    Assessment: Called Alan Beck for 1 month follow up after starting PrEP to assess tolerability. He denies any adverse effects with Descovy and reports two missed doses since starting. He had no issues picking up his medication from [facility] and is due to pick up his next fill. He has no questions or concerns at this time.   Plan: -Continue Descovy for PrEP -Follow up visit (in-person) with Cassie 8/30 1:45pm  I discussed the assessment and treatment plan with the patient. The patient was provided an opportunity to ask questions and all were answered. The patient agreed with the plan and demonstrated an understanding of the instructions.   The patient was advised to call back or seek an in-person evaluation if the symptoms worsen or if the condition fails to improve as anticipated.  I provided 5 minutes of non-face-to-face time during this encounter.  Pervis Hocking, PharmD PGY2 Ambulatory Care Pharmacy Resident 02/12/2021 1:54 PM

## 2021-02-15 ENCOUNTER — Other Ambulatory Visit (HOSPITAL_COMMUNITY): Payer: Self-pay

## 2021-02-16 ENCOUNTER — Other Ambulatory Visit (HOSPITAL_COMMUNITY): Payer: Self-pay

## 2021-03-05 ENCOUNTER — Other Ambulatory Visit (HOSPITAL_COMMUNITY): Payer: Self-pay

## 2021-03-07 ENCOUNTER — Other Ambulatory Visit (HOSPITAL_COMMUNITY): Payer: Self-pay

## 2021-03-13 ENCOUNTER — Other Ambulatory Visit (HOSPITAL_COMMUNITY): Payer: Self-pay

## 2021-03-18 ENCOUNTER — Other Ambulatory Visit (HOSPITAL_COMMUNITY): Payer: Self-pay

## 2021-04-02 ENCOUNTER — Encounter: Payer: Self-pay | Admitting: Internal Medicine

## 2021-04-02 ENCOUNTER — Ambulatory Visit (INDEPENDENT_AMBULATORY_CARE_PROVIDER_SITE_OTHER): Payer: BC Managed Care – PPO | Admitting: Pharmacist

## 2021-04-02 ENCOUNTER — Other Ambulatory Visit: Payer: Self-pay

## 2021-04-02 ENCOUNTER — Telehealth: Payer: Self-pay

## 2021-04-02 ENCOUNTER — Encounter: Payer: Self-pay | Admitting: Pharmacist

## 2021-04-02 ENCOUNTER — Other Ambulatory Visit (HOSPITAL_COMMUNITY): Payer: Self-pay

## 2021-04-02 ENCOUNTER — Other Ambulatory Visit (HOSPITAL_COMMUNITY)
Admission: RE | Admit: 2021-04-02 | Discharge: 2021-04-02 | Disposition: A | Discharge status: Home / Self Care | Payer: BC Managed Care – PPO | Source: Ambulatory Visit | Attending: Infectious Disease | Admitting: Infectious Disease

## 2021-04-02 DIAGNOSIS — Z113 Encounter for screening for infections with a predominantly sexual mode of transmission: Secondary | ICD-10-CM | POA: Insufficient documentation

## 2021-04-02 DIAGNOSIS — Z23 Encounter for immunization: Secondary | ICD-10-CM

## 2021-04-02 DIAGNOSIS — Z79899 Other long term (current) drug therapy: Secondary | ICD-10-CM

## 2021-04-02 NOTE — Telephone Encounter (Signed)
[facility] Patient Advocate Encounter  Apretude is covered under patient medical benefits.  I can call the insurance to see if it will need a PA.   Clearance Coots, CPhT Specialty Pharmacy Patient [facility] Phone: [phone] Fax:  [phone]

## 2021-04-02 NOTE — Progress Notes (Signed)
Date:  04/02/2021   HPI: Alan Beck is a 35 y.o. male who presents to the [facility] for HIV PrEP follow-up.  Insured   [x]    Uninsured  []    Patient Active Problem List   Diagnosis Date Noted   Screening cholesterol level 10/06/2017    Patient's Medications  New Prescriptions   No medications on file  Previous Medications   ALPRAZOLAM (XANAX) 0.5 MG TABLET    Take     1/2 to 1 tablet      2 to 3 x /day      if needed for Anxiety Attack or Sleep   CHOLECALCIFEROL (VITAMIN D PO)    Take by mouth. Takes occasionally   CITALOPRAM (CELEXA) 20 MG TABLET    Take      1 tablet      Daily       for Mood   DOXYCYCLINE (VIBRAMYCIN) 100 MG CAPSULE    Take  2 capsules  Immediately with Food, then  1 capsule  2 x /day  with Meals  for 10 days   EMTRICITABINE-TENOFOVIR AF (DESCOVY) 200-25 MG TABLET    Take 1 tablet by mouth daily.  Modified Medications   No medications on file  Discontinued Medications   No medications on file    Allergies: Allergies  Allergen Reactions   Cephalosporins Rash    Past Medical History: Past Medical History:  Diagnosis Date   Asthma    Kidney stones    Orchitis, left 2013   Dr 12/06/2017   Vitamin D deficiency     Social History: Social History   Socioeconomic History   Marital status: Single    Spouse name: Not on file   Number of children: 0   Years of education: Not on file   Highest education level: Not on file  Occupational History   Occupation: account analyst  Tobacco Use   Smoking status: Never   Smokeless tobacco: Never  Substance and Sexual Activity   Alcohol use: Yes    Comment: rarely   Drug use: No   Sexual activity: Not on file  Other Topics Concern   Not on file  Social History Narrative   Not on file   Social Determinants of Health   Financial Resource Strain: Not on file  Food Insecurity: Not on file  Transportation Needs: Not on file  Physical Activity: Not on file  Stress: Not on file  Social  Connections: Not on file    CHL HIV PREP FLOWSHEET RESULTS 01/10/2021  Insurance Status Insured  Gender at birth Male  Gender identity cis-Male  Risk for HIV Condomless vaginal or anal intercourse  Sex Partners Men only  # sex partners past 3-6 mos 2  Sex activity preferences Insertive and receptive;Oral  Condom use Yes  % condom use 75  Treated for STI? No  HIV symptoms? None  PrEP Eligibility Yes    Labs:  SCr: Lab Results  Component Value Date   CREATININE 1.05 01/10/2021   CREATININE 1.05 08/15/2020   CREATININE 1.02 11/15/2018   CREATININE 0.93 03/18/2018   CREATININE 1.00 10/06/2017   HIV Lab Results  Component Value Date   HIV NON-REACTIVE 01/10/2021   HIV NON-REACTIVE 11/01/2020   HIV NON-REACTIVE 09/06/2018   HIV NON-REACTIVE 06/23/2017   Hepatitis B Lab Results  Component Value Date   HEPBSAB REACTIVE (A) 01/10/2021   HEPBSAG NON-REACTIVE 01/10/2021   Hepatitis C Lab Results  Component Value Date  HEPCAB NON-REACTIVE 01/10/2021   Hepatitis A Lab Results  Component Value Date   HAV NON-REACTIVE 01/10/2021   RPR and STI Lab Results  Component Value Date   LABRPR NON-REACTIVE 01/10/2021   LABRPR NON-REACTIVE 11/01/2020   LABRPR NON-REACTIVE 09/06/2018   LABRPR NON-REACTIVE 06/23/2017    STI Results GC CT  01/10/2021 Negative Negative  01/10/2021 Negative Negative  01/10/2021 Negative Negative    Assessment: Alan Beck is here today to follow up for PrEP. He started Descovy 3 months ago and has been tolerating it well. No issues or side effects that he is aware of. No missed doses except for one when he was on vacation and ran out. No problems getting it from [facility] or changes with his insurance.   He has not had any new partners since his last visit. Screened for acute HIV symptoms such as fatigue, muscle aches, rash, sore throat, lymphadenopathy, headache, night sweats, nausea/vomiting/diarrhea, and fever. Denies any  symptoms.  Will screen for STIs and HIV today. Will send in 3 more months of Descovy if negative. Of note, patient is still not interested in Apretude.   He is interested in the MPX vaccine, Jynneos, today. Will administer first vaccine today and see him back in 28 days for his 2nd dose.  Plan: - HIV antibody, BMET, RPR, urine/rectal/pharyngeal GC/CT swabs for cytology today - Descovy x 3 months if HIV negative - MPX vaccine #1 today - MPX vaccine #2 on 9/28 - F/u for PrEP on 12/1  Sharna Gabrys L. Yichen Gilardi, PharmD, BCIDP, AAHIVP, CPP Clinical Pharmacist Practitioner Infectious Diseases Clinical Pharmacist [facility] 04/02/2021, 2:08 PM

## 2021-04-02 NOTE — Progress Notes (Signed)
   04/02/2021  HPI: Alan Beck is a 35 y.o. male who presents to the [facility] pharmacy clinic for monkeypox immunization.   Alan Beck was observed post immunization for 15 minutes without incident. He was provided with Vaccine Information Sheet and instruction to access the V-Safe system.   Alan Beck was instructed to call 911 with any severe reactions post vaccine: Difficulty breathing  Swelling of face and throat  A fast heartbeat  A bad rash all over body  Dizziness and weakness   Jia Dottavio L. Kamill Fulbright, PharmD, BCIDP, AAHIVP, CPP Clinical Pharmacist Practitioner Infectious Diseases Clinical Pharmacist [facility] 04/02/2021, 5:06 PM

## 2021-04-03 ENCOUNTER — Other Ambulatory Visit: Payer: Self-pay | Admitting: Pharmacist

## 2021-04-03 ENCOUNTER — Other Ambulatory Visit (HOSPITAL_COMMUNITY): Payer: Self-pay

## 2021-04-03 DIAGNOSIS — Z79899 Other long term (current) drug therapy: Secondary | ICD-10-CM

## 2021-04-03 LAB — BASIC METABOLIC PANEL
BUN: 16 mg/dL (ref 7–25)
CO2: 26 mmol/L (ref 20–32)
Calcium: 9.8 mg/dL (ref 8.6–10.3)
Chloride: 106 mmol/L (ref 98–110)
Creat: 1.08 mg/dL (ref 0.60–1.26)
Glucose, Bld: 107 mg/dL — ABNORMAL HIGH (ref 65–99)
Potassium: 4.2 mmol/L (ref 3.5–5.3)
Sodium: 141 mmol/L (ref 135–146)

## 2021-04-03 LAB — URINE CYTOLOGY ANCILLARY ONLY
Chlamydia: NEGATIVE
Comment: NEGATIVE
Comment: NORMAL
Neisseria Gonorrhea: NEGATIVE

## 2021-04-03 LAB — CYTOLOGY, (ORAL, ANAL, URETHRAL) ANCILLARY ONLY
Chlamydia: NEGATIVE
Chlamydia: NEGATIVE
Comment: NEGATIVE
Comment: NEGATIVE
Comment: NORMAL
Comment: NORMAL
Neisseria Gonorrhea: NEGATIVE
Neisseria Gonorrhea: NEGATIVE

## 2021-04-03 LAB — RPR: RPR Ser Ql: NONREACTIVE

## 2021-04-03 LAB — HIV ANTIBODY (ROUTINE TESTING W REFLEX): HIV 1&2 Ab, 4th Generation: NONREACTIVE

## 2021-04-03 MED ORDER — DESCOVY 200-25 MG PO TABS
1.0000 | ORAL_TABLET | Freq: Every day | ORAL | 2 refills | Status: DC
  Filled 2021-04-03 – 2021-05-01 (×2): qty 30, 30d supply, fill #0
  Filled 2021-06-19: qty 30, 30d supply, fill #1

## 2021-04-04 DIAGNOSIS — H3321 Serous retinal detachment, right eye: Secondary | ICD-10-CM | POA: Diagnosis not present

## 2021-04-04 DIAGNOSIS — H43812 Vitreous degeneration, left eye: Secondary | ICD-10-CM | POA: Diagnosis not present

## 2021-04-04 DIAGNOSIS — H33322 Round hole, left eye: Secondary | ICD-10-CM | POA: Diagnosis not present

## 2021-04-04 DIAGNOSIS — H2701 Aphakia, right eye: Secondary | ICD-10-CM | POA: Diagnosis not present

## 2021-04-09 ENCOUNTER — Other Ambulatory Visit (HOSPITAL_COMMUNITY): Payer: Self-pay

## 2021-04-11 ENCOUNTER — Other Ambulatory Visit (HOSPITAL_COMMUNITY): Payer: Self-pay

## 2021-05-01 ENCOUNTER — Ambulatory Visit (INDEPENDENT_AMBULATORY_CARE_PROVIDER_SITE_OTHER): Payer: BC Managed Care – PPO | Admitting: Pharmacist

## 2021-05-01 ENCOUNTER — Other Ambulatory Visit: Payer: Self-pay

## 2021-05-01 ENCOUNTER — Other Ambulatory Visit (HOSPITAL_COMMUNITY): Payer: Self-pay

## 2021-05-01 DIAGNOSIS — Z23 Encounter for immunization: Secondary | ICD-10-CM

## 2021-05-01 NOTE — Progress Notes (Signed)
   05/01/2021  HPI: Sarkis Rhines is a 35 y.o. male who presents to the [facility] pharmacy clinic for monkeypox immunization.   Mr. Kallenbach was observed post immunization for 15 minutes without incident. He was provided with Vaccine Information Sheet and instruction to access the V-Safe system. Patient was asked if he would be interested in receiving the influenza vaccine and/or the hepatitis A vaccine, but stated he was not interested at this time.    Mr. Spickler was instructed to call 911 with any severe reactions post vaccine: Difficulty breathing  Swelling of face and throat  A fast heartbeat  A bad rash all over body  Dizziness and weakness   Plan:  - Jynneos #2/2  - F/U 07/04/2021 for PrEP    Jani Gravel, PharmD PGY-1 Acute Care Resident  05/01/2021 2:09 PM    05/01/2021, 2:09 PM

## 2021-05-03 ENCOUNTER — Other Ambulatory Visit (HOSPITAL_COMMUNITY): Payer: Self-pay

## 2021-05-22 ENCOUNTER — Other Ambulatory Visit (HOSPITAL_COMMUNITY): Payer: Self-pay

## 2021-06-19 ENCOUNTER — Other Ambulatory Visit (HOSPITAL_COMMUNITY): Payer: Self-pay

## 2021-06-21 ENCOUNTER — Other Ambulatory Visit (HOSPITAL_COMMUNITY): Payer: Self-pay

## 2021-06-24 DIAGNOSIS — J3501 Chronic tonsillitis: Secondary | ICD-10-CM | POA: Diagnosis not present

## 2021-06-24 DIAGNOSIS — J358 Other chronic diseases of tonsils and adenoids: Secondary | ICD-10-CM | POA: Diagnosis not present

## 2021-07-02 ENCOUNTER — Other Ambulatory Visit (HOSPITAL_COMMUNITY): Payer: Self-pay

## 2021-07-04 ENCOUNTER — Ambulatory Visit (INDEPENDENT_AMBULATORY_CARE_PROVIDER_SITE_OTHER): Payer: BC Managed Care – PPO | Admitting: Pharmacist

## 2021-07-04 ENCOUNTER — Other Ambulatory Visit (HOSPITAL_COMMUNITY): Payer: Self-pay

## 2021-07-04 ENCOUNTER — Other Ambulatory Visit: Payer: Self-pay

## 2021-07-04 ENCOUNTER — Telehealth: Payer: Self-pay

## 2021-07-04 DIAGNOSIS — Z79899 Other long term (current) drug therapy: Secondary | ICD-10-CM | POA: Diagnosis not present

## 2021-07-04 NOTE — Progress Notes (Signed)
Date:  07/04/2021   HPI: Alan Beck is a 35 y.o. male who presents to the [facility] clinic for HIV PrEP follow-up.  Insured   [x]    Uninsured  []    Patient Active Problem List   Diagnosis Date Noted   Screening cholesterol level 10/06/2017    Patient's Medications  New Prescriptions   No medications on file  Previous Medications   ALPRAZOLAM (XANAX) 0.5 MG TABLET    Take     1/2 to 1 tablet      2 to 3 x /day      if needed for Anxiety Attack or Sleep   CHOLECALCIFEROL (VITAMIN D PO)    Take by mouth. Takes occasionally   CITALOPRAM (CELEXA) 20 MG TABLET    Take      1 tablet      Daily       for Mood   DOXYCYCLINE (VIBRAMYCIN) 100 MG CAPSULE    Take  2 capsules  Immediately with Food, then  1 capsule  2 x /day  with Meals  for 10 days   EMTRICITABINE-TENOFOVIR AF (DESCOVY) 200-25 MG TABLET    Take 1 tablet by mouth daily.  Modified Medications   No medications on file  Discontinued Medications   No medications on file    Allergies: Allergies  Allergen Reactions   Cephalosporins Rash    Past Medical History: Past Medical History:  Diagnosis Date   Asthma    Kidney stones    Orchitis, left 2013   Dr 12/06/2017   Vitamin D deficiency     Social History: Social History   Socioeconomic History   Marital status: Single    Spouse name: Not on file   Number of children: 0   Years of education: Not on file   Highest education level: Not on file  Occupational History   Occupation: account analyst  Tobacco Use   Smoking status: Never   Smokeless tobacco: Never  Substance and Sexual Activity   Alcohol use: Yes    Comment: rarely   Drug use: No   Sexual activity: Not on file  Other Topics Concern   Not on file  Social History Narrative   Not on file   Social Determinants of Health   Financial Resource Strain: Not on file  Food Insecurity: Not on file  Transportation Needs: Not on file  Physical Activity: Not on file  Stress: Not on file  Social  Connections: Not on file    CHL HIV PREP FLOWSHEET RESULTS 01/10/2021  Insurance Status Insured  Gender at birth Male  Gender identity cis-Male  Risk for HIV Condomless vaginal or anal intercourse  Sex Partners Men only  # sex partners past 3-6 mos 2  Sex activity preferences Insertive and receptive;Oral  Condom use Yes  % condom use 75  Treated for STI? No  HIV symptoms? None  PrEP Eligibility Yes    Labs:  SCr: Lab Results  Component Value Date   CREATININE 1.08 04/02/2021   CREATININE 1.05 01/10/2021   CREATININE 1.05 08/15/2020   CREATININE 1.02 11/15/2018   CREATININE 0.93 03/18/2018   HIV Lab Results  Component Value Date   HIV NON-REACTIVE 04/02/2021   HIV NON-REACTIVE 01/10/2021   HIV NON-REACTIVE 11/01/2020   HIV NON-REACTIVE 09/06/2018   HIV NON-REACTIVE 06/23/2017   Hepatitis B Lab Results  Component Value Date   HEPBSAB REACTIVE (A) 01/10/2021   HEPBSAG NON-REACTIVE 01/10/2021   Hepatitis C Lab Results  Component Value Date   HEPCAB NON-REACTIVE 01/10/2021   Hepatitis A Lab Results  Component Value Date   HAV NON-REACTIVE 01/10/2021   RPR and STI Lab Results  Component Value Date   LABRPR NON-REACTIVE 04/02/2021   LABRPR NON-REACTIVE 01/10/2021   LABRPR NON-REACTIVE 11/01/2020   LABRPR NON-REACTIVE 09/06/2018   LABRPR NON-REACTIVE 06/23/2017    STI Results GC CT  04/02/2021 Negative Negative  04/02/2021 Negative Negative  04/02/2021 Negative Negative  01/10/2021 Negative Negative  01/10/2021 Negative Negative  01/10/2021 Negative Negative    Assessment: Alan Beck comes in today to follow up for HIV PrEP. He stopped taking his Descovy about 2.5 weeks ago in preparation for a tonsillectomy that he is having next week. I explained that it was fine to continue taking but he wanted to be extra careful. He has had no new partners since his last visit and has not been sexually active since he stopped Descovy. He will restart once he is able to swallow  after the surgery. He had questions about Apretude, which were answered. He is interested in starting at the beginning of next year, so we will look into his insurance to see if it is covered. Will just check a HIV antibody today and see him back in 3 months.   Plan: - HIV antibody today - Descovy x 3 months if HIV negative - Check insurance regarding Apretude - F/u on 2/28 on 230pm  Glendale Youngblood L. Val Schiavo, PharmD, BCIDP, AAHIVP, CPP Clinical Pharmacist Practitioner Infectious Diseases Clinical Pharmacist [facility] 07/04/2021, 2:05 PM

## 2021-07-04 NOTE — Telephone Encounter (Signed)
[facility] Patient Advocate Encounter   Received notification from [facility] [place] that prior authorization for Apretude is required.   PA submitted on 07/04/21 Key [id number] Status is pending    [facility] will continue to follow.   Clearance Coots, CPhT Specialty Pharmacy Patient [facility] for Infectious Disease Phone: [phone] Fax:  [phone]

## 2021-07-04 NOTE — Patient Instructions (Signed)
Apretude

## 2021-07-05 LAB — HIV ANTIBODY (ROUTINE TESTING W REFLEX): HIV 1&2 Ab, 4th Generation: NONREACTIVE

## 2021-07-08 ENCOUNTER — Telehealth: Payer: Self-pay

## 2021-07-08 NOTE — Telephone Encounter (Addendum)
[facility] Patient Advocate Encounter  Apretude will be covered under patient medical benefits.  Patient will have a $150.00 office visit copay, and an out of pocket deductible of $8550.00 which only $ 1604.00 has been met.   Ref # 07/08/21 StaceyH & [id number]  Patient is enrolled in [place] Patient [facility] for Infectious Disease Phone: [phone] Fax:  [phone]

## 2021-07-09 ENCOUNTER — Other Ambulatory Visit (HOSPITAL_COMMUNITY): Payer: Self-pay

## 2021-07-09 ENCOUNTER — Other Ambulatory Visit: Payer: Self-pay | Admitting: Pharmacist

## 2021-07-09 ENCOUNTER — Other Ambulatory Visit: Payer: Self-pay | Admitting: Otolaryngology

## 2021-07-09 ENCOUNTER — Encounter: Payer: Self-pay | Admitting: Pharmacist

## 2021-07-09 DIAGNOSIS — Z79899 Other long term (current) drug therapy: Secondary | ICD-10-CM

## 2021-07-09 DIAGNOSIS — J3501 Chronic tonsillitis: Secondary | ICD-10-CM | POA: Diagnosis not present

## 2021-07-09 DIAGNOSIS — J358 Other chronic diseases of tonsils and adenoids: Secondary | ICD-10-CM | POA: Diagnosis not present

## 2021-07-09 DIAGNOSIS — J351 Hypertrophy of tonsils: Secondary | ICD-10-CM | POA: Diagnosis not present

## 2021-07-09 MED ORDER — DESCOVY 200-25 MG PO TABS
1.0000 | ORAL_TABLET | Freq: Every day | ORAL | 2 refills | Status: DC
  Filled 2021-07-09 – 2021-07-26 (×3): qty 30, 30d supply, fill #0

## 2021-07-09 NOTE — Progress Notes (Signed)
Patient's HIV antibody is negative.  Will send in 3 more months of Descovy to [facility].  

## 2021-07-22 ENCOUNTER — Other Ambulatory Visit (HOSPITAL_COMMUNITY): Payer: Self-pay

## 2021-07-25 ENCOUNTER — Other Ambulatory Visit (HOSPITAL_COMMUNITY): Payer: Self-pay

## 2021-07-26 ENCOUNTER — Other Ambulatory Visit (HOSPITAL_COMMUNITY): Payer: Self-pay

## 2021-08-01 ENCOUNTER — Other Ambulatory Visit (HOSPITAL_COMMUNITY): Payer: Self-pay

## 2021-08-22 ENCOUNTER — Other Ambulatory Visit (HOSPITAL_COMMUNITY): Payer: Self-pay

## 2021-08-26 ENCOUNTER — Other Ambulatory Visit (HOSPITAL_COMMUNITY): Payer: Self-pay

## 2021-08-28 ENCOUNTER — Other Ambulatory Visit (HOSPITAL_COMMUNITY): Payer: Self-pay

## 2021-08-30 ENCOUNTER — Encounter: Payer: Self-pay | Admitting: Internal Medicine

## 2021-09-11 ENCOUNTER — Telehealth: Payer: Self-pay

## 2021-09-11 ENCOUNTER — Other Ambulatory Visit (HOSPITAL_COMMUNITY): Payer: Self-pay

## 2021-09-11 NOTE — Telephone Encounter (Signed)
[facility] Patient Advocate Encounter  [facility] and I have been unsuccsessful in reaching patient to be able to refill medication.    We have tried multiple times without a response.  Quinne Pires, CPhT Specialty Pharmacy Patient [facility] Phone: [phone] Fax:  [phone]  

## 2021-10-01 ENCOUNTER — Ambulatory Visit: Payer: BC Managed Care – PPO | Admitting: Pharmacist

## 2021-10-16 ENCOUNTER — Ambulatory Visit: Payer: BC Managed Care – PPO | Admitting: Pharmacist

## 2021-10-30 ENCOUNTER — Other Ambulatory Visit (HOSPITAL_COMMUNITY): Payer: Self-pay

## 2021-11-04 ENCOUNTER — Ambulatory Visit: Payer: BC Managed Care – PPO | Admitting: Pharmacist

## 2021-11-16 ENCOUNTER — Other Ambulatory Visit: Payer: Self-pay | Admitting: Internal Medicine

## 2021-11-16 DIAGNOSIS — F419 Anxiety disorder, unspecified: Secondary | ICD-10-CM

## 2021-11-18 ENCOUNTER — Other Ambulatory Visit: Payer: Self-pay | Admitting: Pharmacist

## 2021-11-18 ENCOUNTER — Ambulatory Visit (INDEPENDENT_AMBULATORY_CARE_PROVIDER_SITE_OTHER): Payer: BC Managed Care – PPO | Admitting: Pharmacist

## 2021-11-18 ENCOUNTER — Other Ambulatory Visit: Payer: Self-pay

## 2021-11-18 DIAGNOSIS — Z79899 Other long term (current) drug therapy: Secondary | ICD-10-CM | POA: Diagnosis not present

## 2021-11-18 DIAGNOSIS — Z113 Encounter for screening for infections with a predominantly sexual mode of transmission: Secondary | ICD-10-CM

## 2021-11-18 NOTE — Progress Notes (Signed)
? ?Date:  11/18/2021  ? ?HPI: Alan Beck is a 36 y.o. male who presents to the [facility] clinic for HIV PrEP follow-up. ? ?Insured   [x]    Uninsured  []   ? ?Patient Active Problem List  ? Diagnosis Date Noted  ? Screening cholesterol level 10/06/2017  ? ? ?Patient's Medications  ?New Prescriptions  ? No medications on file  ?Previous Medications  ? ALPRAZOLAM (XANAX) 0.5 MG TABLET    Take 1/2 to 1 tablet 2 x / day  ONLY if needed for Anxiety Attack or Sleep  & please try to limit to 5 days /week to avoid Addiction & Dementia                                                        /                                       TAKE                  BY                MOUTH  ? CHOLECALCIFEROL (VITAMIN D PO)    Take by mouth. Takes occasionally  ? CITALOPRAM (CELEXA) 20 MG TABLET    Take      1 tablet      Daily       for Mood  ? DOXYCYCLINE (VIBRAMYCIN) 100 MG CAPSULE    Take  2 capsules  Immediately with Food, then  1 capsule  2 x /day  with Meals  for 10 days  ? EMTRICITABINE-TENOFOVIR AF (DESCOVY) 200-25 MG TABLET    Take 1 tablet by mouth daily.  ?Modified Medications  ? No medications on file  ?Discontinued Medications  ? No medications on file  ? ? ?Allergies: ?Allergies  ?Allergen Reactions  ? Cephalosporins Rash  ? ? ?Past Medical History: ?Past Medical History:  ?Diagnosis Date  ? Asthma   ? Kidney stones   ? Orchitis, left 2013  ? Dr Risa Grill  ? Vitamin D deficiency   ? ? ?Social History: ?Social History  ? ?Socioeconomic History  ? Marital status: Single  ?  Spouse name: Not on file  ? Number of children: 0  ? Years of education: Not on file  ? Highest education level: Not on file  ?Occupational History  ? Occupation: account analyst  ?Tobacco Use  ? Smoking status: Never  ? Smokeless tobacco: Never  ?Substance and Sexual Activity  ? Alcohol use: Yes  ?  Comment: rarely  ? Drug use: No  ? Sexual activity: Not on file  ?Other Topics Concern  ? Not on file  ?Social History Narrative  ? Not on file  ? ?Social  Determinants of Health  ? ?Financial Resource Strain: Not on file  ?Food Insecurity: Not on file  ?Transportation Needs: Not on file  ?Physical Activity: Not on file  ?Stress: Not on file  ?Social Connections: Not on file  ? ? ? ?  01/10/2021  ?  2:56 PM  ?CHL HIV PREP FLOWSHEET RESULTS  ?Insurance Status Insured  ?Gender at birth Male  ?Gender identity cis-Male  ?Risk for HIV Condomless vaginal or anal intercourse  ?  Sex Partners Men only  ?# sex partners past 3-6 mos 2  ?Sex activity preferences Insertive and receptive;Oral  ?Condom use Yes  ?% condom use 75  ?Treated for STI? No  ?HIV symptoms? None  ?PrEP Eligibility Yes  ? ? ?Labs: ? ?SCr: ?Lab Results  ?Component Value Date  ? CREATININE 1.08 04/02/2021  ? CREATININE 1.05 01/10/2021  ? CREATININE 1.05 08/15/2020  ? CREATININE 1.02 11/15/2018  ? CREATININE 0.93 03/18/2018  ? ?HIV ?Lab Results  ?Component Value Date  ? HIV NON-REACTIVE 07/04/2021  ? HIV NON-REACTIVE 04/02/2021  ? HIV NON-REACTIVE 01/10/2021  ? HIV NON-REACTIVE 11/01/2020  ? HIV NON-REACTIVE 09/06/2018  ? ?Hepatitis B ?Lab Results  ?Component Value Date  ? HEPBSAB REACTIVE (A) 01/10/2021  ? HEPBSAG NON-REACTIVE 01/10/2021  ? ?Hepatitis C ?Lab Results  ?Component Value Date  ? HEPCAB NON-REACTIVE 01/10/2021  ? ?Hepatitis A ?Lab Results  ?Component Value Date  ? HAV NON-REACTIVE 01/10/2021  ? ?RPR and STI ?Lab Results  ?Component Value Date  ? LABRPR NON-REACTIVE 04/02/2021  ? LABRPR NON-REACTIVE 01/10/2021  ? LABRPR NON-REACTIVE 11/01/2020  ? LABRPR NON-REACTIVE 09/06/2018  ? LABRPR NON-REACTIVE 06/23/2017  ? ? ?STI Results GC CT  ?04/02/2021 ? 2:32 PM Negative    ? Negative    ? Negative   Negative    ? Negative    ? Negative    ?01/10/2021 ? 3:06 PM Negative    ? Negative    ? Negative   Negative    ? Negative    ? Negative    ? ? ?Assessment: ?Alan Beck is here today for HIV PrEP follow-up. He reports that he has been abstaining from sexual activity, and stopped taking his Descovy a couple months ago.  No new partners since last visit; he politely declined STI testing today. ? ?He had questions about Apretude, which were answered. Counseled that Apretude is one intramuscular injection in the gluteal muscle for each visit. Explained that the second injection is 30 days after the initial injection then every 2 months thereafter. Discussed that it is required to have a negative HIV test immediately prior to injection administration. A rapid HIV blood test will be drawn each visit, and patient must wait for results before getting injection, which typically takes 20-30 minutes. Explained that showing up to injection appointments is very important and warned that if appointments are missed, protection will be minimal and the risk of acquiring HIV becomes much higher. Counseled on possible side effects associated with the injections such as injection site pain, which is usually mild to moderate in nature, injection site nodules, and injection site reactions. Advised that they can take Motrin or Tylenol for injection site pain if needed. They may also pre-treat with Motrin or Tylenol 30-45 minutes before scheduled appointments. He would like to resume his oral PrEP regimen with Descovy for now, and may consider starting Apretude in the future. ? ?Plan: ?- Check HIV antibody, BMET, and lipid panel today  ?- Will send Descovy x 3 months, if HIV antibody negative  ?- Check insurance regarding Apretude  ?- Next follow-up visit scheduled for 7/17 with Cassie  ? ?Vance Peper, PharmD ?PGY1 Pharmacy Resident ?11/18/2021 2:45 PM  ?

## 2021-11-19 ENCOUNTER — Other Ambulatory Visit: Payer: Self-pay

## 2021-11-19 ENCOUNTER — Encounter (HOSPITAL_COMMUNITY): Payer: Self-pay | Admitting: Emergency Medicine

## 2021-11-19 ENCOUNTER — Emergency Department (HOSPITAL_COMMUNITY)
Admission: EM | Admit: 2021-11-19 | Discharge: 2021-11-19 | Disposition: A | Discharge status: Home / Self Care | Payer: BC Managed Care – PPO | Attending: Emergency Medicine | Admitting: Emergency Medicine

## 2021-11-19 ENCOUNTER — Emergency Department (HOSPITAL_COMMUNITY): Payer: BC Managed Care – PPO

## 2021-11-19 DIAGNOSIS — N132 Hydronephrosis with renal and ureteral calculous obstruction: Secondary | ICD-10-CM | POA: Diagnosis not present

## 2021-11-19 DIAGNOSIS — J45909 Unspecified asthma, uncomplicated: Secondary | ICD-10-CM | POA: Diagnosis not present

## 2021-11-19 DIAGNOSIS — K3189 Other diseases of stomach and duodenum: Secondary | ICD-10-CM | POA: Diagnosis not present

## 2021-11-19 DIAGNOSIS — E876 Hypokalemia: Secondary | ICD-10-CM | POA: Insufficient documentation

## 2021-11-19 DIAGNOSIS — R109 Unspecified abdominal pain: Secondary | ICD-10-CM | POA: Diagnosis not present

## 2021-11-19 DIAGNOSIS — N200 Calculus of kidney: Secondary | ICD-10-CM

## 2021-11-19 LAB — URINALYSIS, ROUTINE W REFLEX MICROSCOPIC
Bilirubin Urine: NEGATIVE
Glucose, UA: NEGATIVE mg/dL
Ketones, ur: 20 mg/dL — AB
Leukocytes,Ua: NEGATIVE
Nitrite: NEGATIVE
Protein, ur: 30 mg/dL — AB
RBC / HPF: >50 RBC/hpf — ABNORMAL HIGH (ref 0–5)
Specific Gravity, Urine: 1.024 (ref 1.005–1.030)
pH: 5 (ref 5.0–8.0)

## 2021-11-19 LAB — CBC
HCT: 44.1 % (ref 39.0–52.0)
Hemoglobin: 15.3 g/dL (ref 13.0–17.0)
MCH: 30.7 pg (ref 26.0–34.0)
MCHC: 34.7 g/dL (ref 30.0–36.0)
MCV: 88.6 fL (ref 80.0–100.0)
Platelets: 338 10*3/uL (ref 150–400)
RBC: 4.98 MIL/uL (ref 4.22–5.81)
RDW: 11.6 % (ref 11.5–15.5)
WBC: 10.3 10*3/uL (ref 4.0–10.5)
nRBC: 0 % (ref 0.0–0.2)

## 2021-11-19 LAB — BASIC METABOLIC PANEL
Anion gap: 15 (ref 5–15)
BUN: 17 mg/dL (ref 6–20)
CO2: 19 mmol/L — ABNORMAL LOW (ref 22–32)
Calcium: 9.5 mg/dL (ref 8.9–10.3)
Chloride: 108 mmol/L (ref 98–111)
Creatinine, Ser: 1.46 mg/dL — ABNORMAL HIGH (ref 0.61–1.24)
GFR, Estimated: >60 mL/min (ref >60)
Glucose, Bld: 152 mg/dL — ABNORMAL HIGH (ref 70–99)
Potassium: 3.3 mmol/L — ABNORMAL LOW (ref 3.5–5.1)
Sodium: 142 mmol/L (ref 135–145)

## 2021-11-19 MED ORDER — ONDANSETRON 8 MG PO TBDP
8.0000 mg | ORAL_TABLET | Freq: Once | ORAL | Status: AC
  Administered 2021-11-19: 8 mg via ORAL
  Filled 2021-11-19: qty 1

## 2021-11-19 MED ORDER — ONDANSETRON HCL 8 MG PO TABS: 4.0000 mg | ORAL_TABLET | Freq: Three times a day (TID) | ORAL | 0 refills | Status: AC | PRN

## 2021-11-19 MED ORDER — OXYCODONE-ACETAMINOPHEN 5-325 MG PO TABS: 1.0000 | ORAL_TABLET | Freq: Four times a day (QID) | ORAL | 0 refills | Status: AC | PRN

## 2021-11-19 MED ORDER — SODIUM CHLORIDE 0.9 % IV BOLUS
1000.0000 mL | Freq: Once | INTRAVENOUS | Status: DC
Start: 2021-11-19 — End: 2021-11-19

## 2021-11-19 MED ORDER — HYDROMORPHONE HCL 1 MG/ML IJ SOLN
1.0000 mg | Freq: Once | INTRAMUSCULAR | Status: AC
  Administered 2021-11-19: 1 mg via INTRAVENOUS
  Filled 2021-11-19: qty 1

## 2021-11-19 MED ORDER — KETOROLAC TROMETHAMINE 15 MG/ML IJ SOLN
15.0000 mg | Freq: Once | INTRAMUSCULAR | Status: AC
  Administered 2021-11-19: 15 mg via INTRAVENOUS
  Filled 2021-11-19: qty 1

## 2021-11-19 MED ORDER — TAMSULOSIN HCL 0.4 MG PO CAPS: 0.4000 mg | ORAL_CAPSULE | Freq: Every day | ORAL | 0 refills | Status: AC

## 2021-11-19 MED ORDER — SODIUM CHLORIDE 0.9 % IV BOLUS
500.0000 mL | Freq: Once | INTRAVENOUS | Status: AC
  Administered 2021-11-19: 500 mL via INTRAVENOUS

## 2021-11-19 NOTE — ED Provider Notes (Signed)
?[facility]-EMERGENCY DEPT ?Provider Note ? ? ?CSN: [id number] ?Arrival date & time: 11/19/21  2106 ? ?  ? ?History ? ?Chief Complaint  ?Patient presents with  ? Flank Pain  ? Groin Pain  ? ? ?Alan Beck is a 36 y.o. male with medical history of kidney stones, orchitis, asthma.  Patient presents ED for evaluation of groin pain.  Patient states that yesterday he had transient episode of abdominal/groin pain with referred pain to the tip of his penis.  Patient reports that this episode was brief and resolved on its own.  Patient states that today, he was sitting at home watching TV when he had sudden onset groin pain that radiates to his right flank.  The patient states that he has a history of kidney stones.  Patient reports that this pain feels as a "pressure".  Patient endorses flank pain, nausea, vomiting.  Patient denies any fevers, difficulty urinating, dysuria, blood in urine, penile discharge. ? ? ?Flank Pain ?Associated symptoms include abdominal pain.  ?Groin Pain ?Associated symptoms include abdominal pain.  ? ?  ? ?Home Medications ?Prior to Admission medications   ?Medication Sig Start Date End Date Taking? Authorizing Provider  ?ondansetron (ZOFRAN) 8 MG tablet Take 0.5 tablets (4 mg total) by mouth every 8 (eight) hours as needed for nausea or vomiting. 11/19/21  Yes Al Decant, PA-C  ?oxyCODONE-acetaminophen (PERCOCET/ROXICET) 5-325 MG tablet Take 1 tablet by mouth every 6 (six) hours as needed for severe pain. 11/19/21  Yes Al Decant, PA-C  ?tamsulosin (FLOMAX) 0.4 MG CAPS capsule Take 1 capsule (0.4 mg total) by mouth daily after breakfast. 11/19/21  Yes Al Decant, PA-C  ?ALPRAZolam (XANAX) 0.5 MG tablet Take 1/2 to 1 tablet 2 x / day  ONLY if needed for Anxiety Attack or Sleep  & please try to limit to 5 days /week to avoid Addiction & Dementia                                                        /                                       TAKE                   BY                MOUTH 11/16/21   Lucky Cowboy, MD  ?Cholecalciferol (VITAMIN D PO) Take by mouth. Takes occasionally    [provider]  ?citalopram (CELEXA) 20 MG tablet Take      1 tablet      Daily       for Mood 06/20/20   Lucky Cowboy, MD  ?doxycycline (VIBRAMYCIN) 100 MG capsule Take  2 capsules  Immediately with Food, then  1 capsule  2 x /day  with Meals  for 10 days 11/02/20   Lucky Cowboy, MD  ?emtricitabine-tenofovir AF (DESCOVY) 200-25 MG tablet Take 1 tablet by mouth daily. 07/09/21   Kuppelweiser, Cassie L, RPH-CPP  ?   ? ?Allergies    ?Cephalosporins   ? ?Review of Systems   ?Review of Systems  ?Constitutional:  Negative for fever.  ?Gastrointestinal:  Positive for  abdominal pain, nausea and vomiting.  ?Genitourinary:  Positive for flank pain. Negative for difficulty urinating, dysuria, hematuria and penile discharge.  ?All other systems reviewed and are negative. ? ?Physical Exam ?Updated Vital Signs ?BP 107/64   Pulse 93   Temp 98.1 ?F (36.7 ?C) (Oral)   Resp 20   Ht 5\' 10"  (1.778 m)   Wt 64.4 kg   SpO2 98%   BMI 20.37 kg/m?  ?Physical Exam ?Vitals and nursing note reviewed.  ?Constitutional:   ?   General: He is not in acute distress. ?   Appearance: Normal appearance. He is not ill-appearing, toxic-appearing or diaphoretic.  ?HENT:  ?   Head: Normocephalic and atraumatic.  ?   Nose: Nose normal. No congestion.  ?   Mouth/Throat:  ?   Mouth: Mucous membranes are dry.  ?   Pharynx: Oropharynx is clear.  ?Eyes:  ?   Extraocular Movements: Extraocular movements intact.  ?   Conjunctiva/sclera: Conjunctivae normal.  ?   Pupils: Pupils are equal, round, and reactive to light.  ?Cardiovascular:  ?   Rate and Rhythm: Normal rate and regular rhythm.  ?Pulmonary:  ?   Effort: Pulmonary effort is normal.  ?   Breath sounds: Normal breath sounds. No wheezing.  ?Abdominal:  ?   General: Abdomen is flat. Bowel sounds are normal.  ?   Palpations: Abdomen is soft.  ?    Tenderness: There is right CVA tenderness. There is no left CVA tenderness.  ?Musculoskeletal:  ?   Cervical back: Normal range of motion and neck supple. No tenderness.  ?Skin: ?   General: Skin is warm and dry.  ?   Capillary Refill: Capillary refill takes less than 2 seconds.  ?Neurological:  ?   Mental Status: He is alert and oriented to person, place, and time.  ? ? ?ED Results / Procedures / Treatments   ?Labs ?(all labs ordered are listed, but only abnormal results are displayed) ?Labs Reviewed  ?BASIC METABOLIC PANEL - Abnormal; Notable for the following components:  ?    Result Value  ? Potassium 3.3 (*)   ? CO2 19 (*)   ? Glucose, Bld 152 (*)   ? Creatinine, Ser 1.46 (*)   ? All other components within normal limits  ?CBC  ?URINALYSIS, ROUTINE W REFLEX MICROSCOPIC  ? ? ?EKG ?None ? ?Radiology ?CT Renal Stone Study ? ?Result Date: 11/19/2021 ?CLINICAL DATA:  Flank pain, kidney stone suspected EXAM: CT ABDOMEN AND PELVIS WITHOUT CONTRAST TECHNIQUE: Multidetector CT imaging of the abdomen and pelvis was performed following the standard protocol without IV contrast. RADIATION DOSE REDUCTION: This exam was performed according to the departmental dose-optimization program which includes automated exposure control, adjustment of the mA and/or kV according to patient size and/or use of iterative reconstruction technique. COMPARISON:  CT abdomen pelvis 09/27/2010 FINDINGS: Lower chest: No acute abnormality. Hepatobiliary: No focal liver abnormality is seen. The gallbladder is unremarkable. Pancreas: Unremarkable. No pancreatic ductal dilatation or surrounding inflammatory changes. Spleen: Normal in size without focal abnormality. Adrenals/Urinary Tract: Adrenal glands are unremarkable. There is right-sided hydroureteronephrosis due to an obstructing 5 mm stone in the distal right ureter. Bladder is unremarkable. Stomach/Bowel: The stomach is distended with recently ingested material. There is no evidence of bowel  obstruction.Prior appendectomy. Vascular/Lymphatic: No significant vascular findings are present. No enlarged abdominal or pelvic lymph nodes. Reproductive: Unremarkable. Other: No abdominal wall hernia or abnormality. No abdominopelvic ascites. Musculoskeletal: No acute osseous abnormality. 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 IMPRESSION: Obstructing 5 mm stone in the distal right ureter with upstream right-sided hydroureteronephrosis. Electronically Signed   By: Caprice RenshawJacob  Kahn M.D.   On: 11/19/2021 21:36   ? ?Procedures ?Procedures  ? ? ?Medications Ordered in ED ?Medications  ?HYDROmorphone (DILAUDID) injection 1 mg (1 mg Intravenous Given 11/19/21 2132)  ?ondansetron (ZOFRAN-ODT) disintegrating tablet 8 mg (8 mg Oral Given 11/19/21 2133)  ?ketorolac (TORADOL) 15 MG/ML injection 15 mg (15 mg Intravenous Given 11/19/21 2214)  ?sodium chloride 0.9 % bolus 500 mL (0 mLs Intravenous Stopped 11/19/21 2315)  ? ? ?ED Course/ Medical Decision Making/ A&P ?  ?                        ?Medical Decision Making ?Amount and/or Complexity of Data Reviewed ?Labs: ordered. ?Radiology: ordered. ? ?Risk ?Prescription drug management. ? ? ?36 year old male presents to the ED for evaluation of groin pain and right-sided flank pain.  Please see HPI for further details. ? ?On examination, the patient is in obvious distress.  The patient is throwing up and writhing in pain in triage.  The patient is afebrile, nontachycardic, not hypoxic.  The patient lung sounds are clear bilaterally.  The patient abdomen is soft and compressible all 4 quadrants.  The patient has right-sided CVA tenderness. ? ?Patient worked up utilizing the following labs imaging studies interpreted by me personally: ?- BMP shows slightly decreased potassium to 3.3.  The patient will be advised to increase oral intake with fluids rich in potassium as an outpatient.  Patient also has slightly elevated creatinine of 1.46, patient provided with 1 L normal saline. ?-  CBC unremarkable, no elevated white blood cell count ?- CT renal stone study shows 5 mm obstructing stone in distal right ureter with upstream hydroureteronephrosis. ?- Urinalysis pending ? ?Patient treated with 15

## 2021-11-19 NOTE — Discharge Instructions (Addendum)
Please return to the ED with any new signs or symptoms such as inability to urinate, fevers, obvious blood in urine ?Please make a concerted effort to drink plenty of fluid. ?Please follow-up with urology.  You will need to call to make an appointment. ?Please utilize the strainer I provided to you. ?Please pick up your antinausea medication and your pain medication.  Please do not drive or operate heavy machinery while under the influence of pain medication.  Please be aware that pain medication will constipate you, it might be in your best interest to get MiraLAX or Metamucil fiber. ?Please refer and review the attached informational guides concerning kidney stones and dietary guidelines to help prevent kidney stones ?

## 2021-11-19 NOTE — ED Triage Notes (Signed)
Pt reports burning pain through his groin area which has traveled to his right flank area. Pt reports the pain began about an hour ago. Pt reports nausea but no vomiting at this time.  ?

## 2021-11-20 ENCOUNTER — Other Ambulatory Visit: Payer: Self-pay

## 2021-11-20 ENCOUNTER — Other Ambulatory Visit (HOSPITAL_COMMUNITY): Payer: Self-pay

## 2021-11-20 DIAGNOSIS — Z79899 Other long term (current) drug therapy: Secondary | ICD-10-CM

## 2021-11-20 MED ORDER — DESCOVY 200-25 MG PO TABS
1.0000 | ORAL_TABLET | Freq: Every day | ORAL | 2 refills | Status: AC
  Filled 2021-11-20: qty 30, 30d supply, fill #0

## 2021-11-28 ENCOUNTER — Other Ambulatory Visit (HOSPITAL_COMMUNITY): Payer: Self-pay

## 2021-12-06 LAB — LIPID PANEL
Cholesterol: 134 mg/dL (ref <200)
HDL: 45 mg/dL (ref >=40)
LDL Cholesterol (Calc): 74 mg/dL (calc)
Non-HDL Cholesterol (Calc): 89 mg/dL (calc) (ref <130)
Total CHOL/HDL Ratio: 3 (calc) (ref <5.0)
Triglycerides: 71 mg/dL (ref <150)

## 2021-12-06 LAB — BASIC METABOLIC PANEL
BUN: 17 mg/dL (ref 7–25)
CO2: 24 mmol/L (ref 20–32)
Calcium: 9.6 mg/dL (ref 8.6–10.3)
Chloride: 107 mmol/L (ref 98–110)
Creat: 0.99 mg/dL (ref 0.60–1.26)
Glucose, Bld: 102 mg/dL — ABNORMAL HIGH (ref 65–99)
Potassium: 4.1 mmol/L (ref 3.5–5.3)
Sodium: 141 mmol/L (ref 135–146)

## 2021-12-06 LAB — HIV ANTIBODY (ROUTINE TESTING W REFLEX): HIV 1&2 Ab, 4th Generation: NONREACTIVE

## 2021-12-06 LAB — HIV RNA, RTPCR W/R GT (RTI, PI,INT)

## 2022-01-15 ENCOUNTER — Other Ambulatory Visit (HOSPITAL_COMMUNITY): Payer: Self-pay

## 2022-02-05 DIAGNOSIS — Z1321 Encounter for screening for nutritional disorder: Secondary | ICD-10-CM | POA: Diagnosis not present

## 2022-02-05 DIAGNOSIS — Z13228 Encounter for screening for other metabolic disorders: Secondary | ICD-10-CM | POA: Diagnosis not present

## 2022-02-05 DIAGNOSIS — Z0000 Encounter for general adult medical examination without abnormal findings: Secondary | ICD-10-CM | POA: Diagnosis not present

## 2022-02-05 DIAGNOSIS — R7301 Impaired fasting glucose: Secondary | ICD-10-CM | POA: Diagnosis not present

## 2022-02-05 DIAGNOSIS — F419 Anxiety disorder, unspecified: Secondary | ICD-10-CM | POA: Diagnosis not present

## 2022-02-05 DIAGNOSIS — Z13220 Encounter for screening for lipoid disorders: Secondary | ICD-10-CM | POA: Diagnosis not present

## 2022-02-05 DIAGNOSIS — Z130 Encounter for screening for diseases of the blood and blood-forming organs and certain disorders involving the immune mechanism: Secondary | ICD-10-CM | POA: Diagnosis not present

## 2022-02-05 DIAGNOSIS — Z0001 Encounter for general adult medical examination with abnormal findings: Secondary | ICD-10-CM | POA: Diagnosis not present

## 2022-02-17 ENCOUNTER — Ambulatory Visit: Payer: BC Managed Care – PPO | Admitting: Pharmacist

## 2022-02-27 DIAGNOSIS — F419 Anxiety disorder, unspecified: Secondary | ICD-10-CM | POA: Diagnosis not present

## 2022-02-27 DIAGNOSIS — R0602 Shortness of breath: Secondary | ICD-10-CM | POA: Diagnosis not present

## 2022-03-12 ENCOUNTER — Other Ambulatory Visit: Payer: Self-pay | Admitting: Internal Medicine

## 2022-03-12 ENCOUNTER — Ambulatory Visit
Admission: RE | Admit: 2022-03-12 | Discharge: 2022-03-12 | Disposition: A | Discharge status: Home / Self Care | Payer: BC Managed Care – PPO | Source: Ambulatory Visit | Attending: Internal Medicine | Admitting: Internal Medicine

## 2022-03-12 DIAGNOSIS — R0602 Shortness of breath: Secondary | ICD-10-CM

## 2022-03-12 DIAGNOSIS — Q676 Pectus excavatum: Secondary | ICD-10-CM | POA: Diagnosis not present

## 2023-04-12 IMAGING — CT CT RENAL STONE PROTOCOL
2 of 4 series · 16 of 46 positions shown, 18 images · non-contrast
Comparison: CT abdomen pelvis 09/27/2010

CLINICAL DATA: Flank pain, kidney stone suspected



[Series 2: axial st · axial · 0.76mm/px · z∈[-433,-53]mm · 13 of 86 slices shown, 15 images]
[im 5/86  soft-tissue]
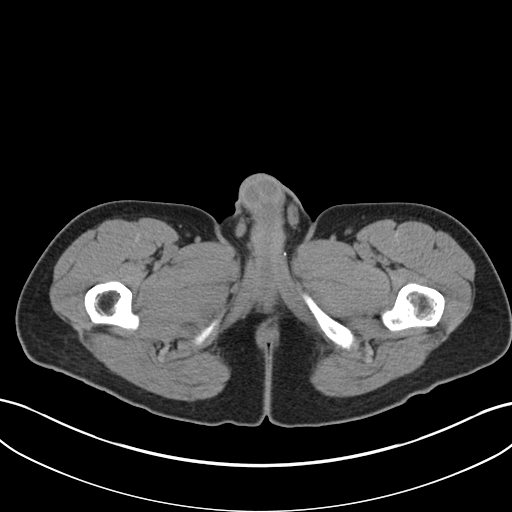
[im 5/86  bone]
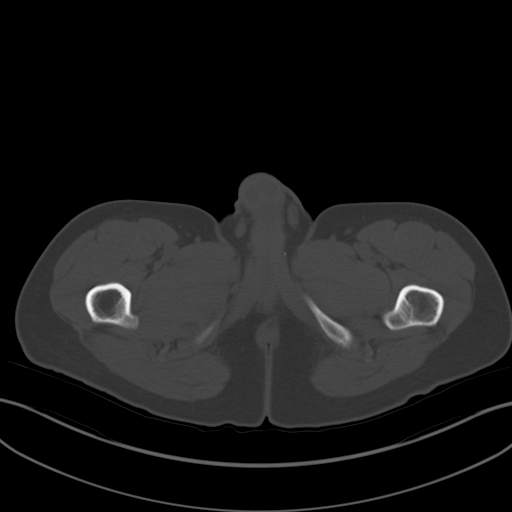
[im 10/86  soft-tissue]
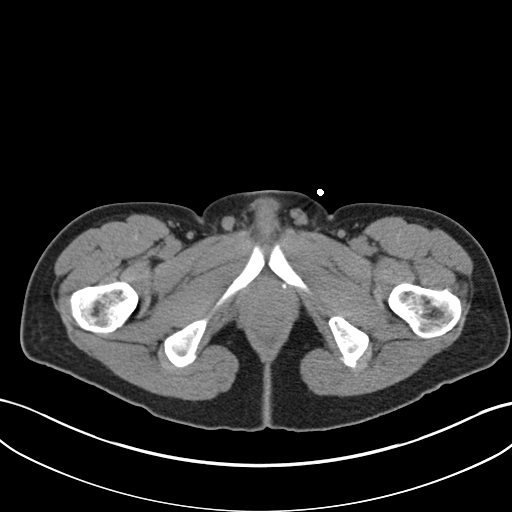
[im 19/86  soft-tissue]
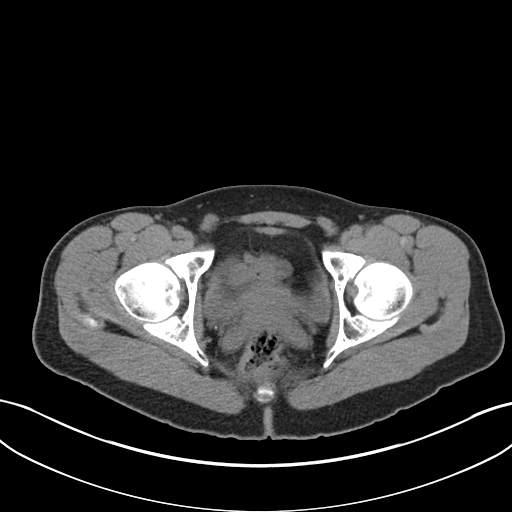
[im 24/86  soft-tissue]
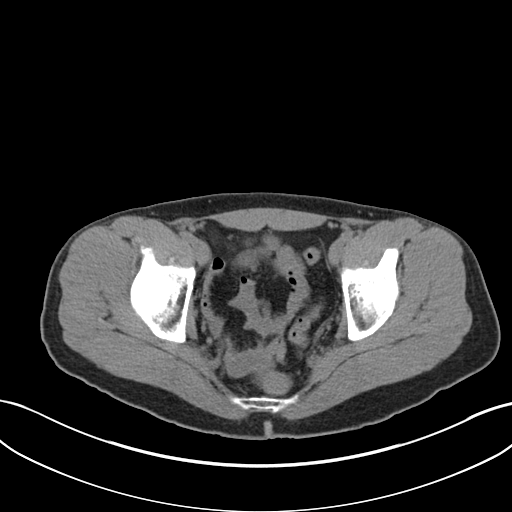
[im 29/86  soft-tissue]
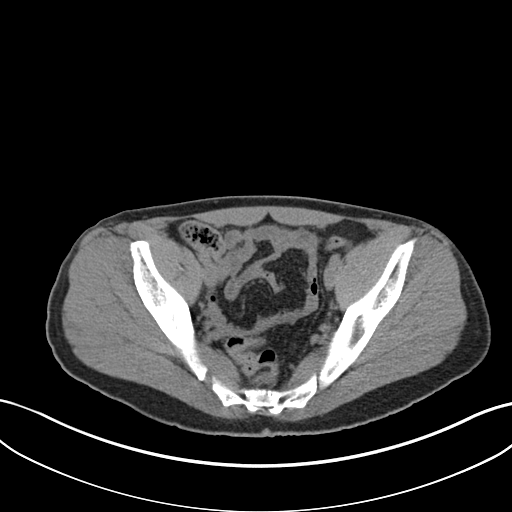
[im 38/86  soft-tissue]
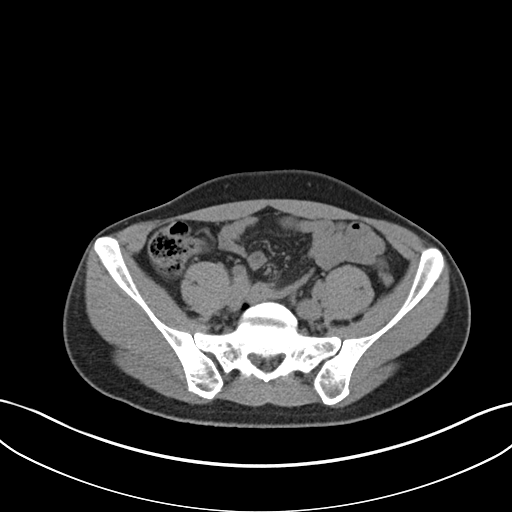
[im 43/86  soft-tissue]
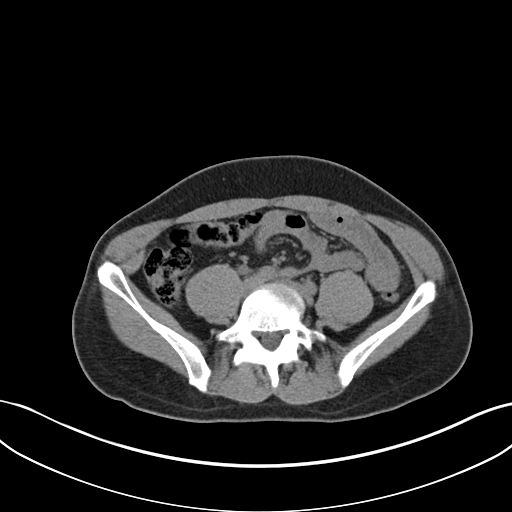
[im 48/86  soft-tissue]
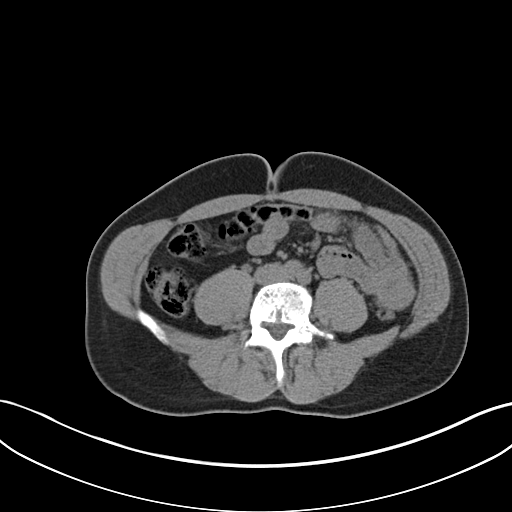
[im 57/86  soft-tissue]
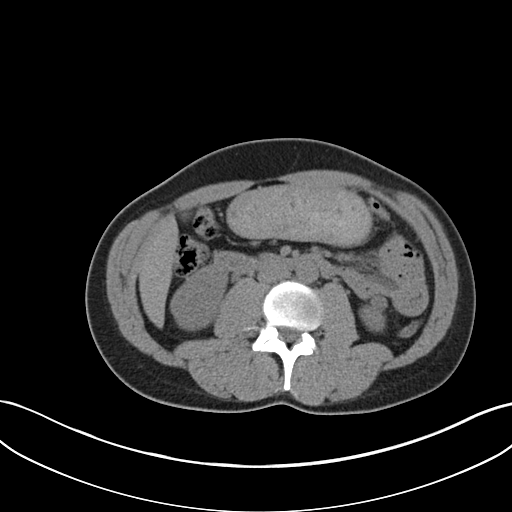
[im 57/86  bone]
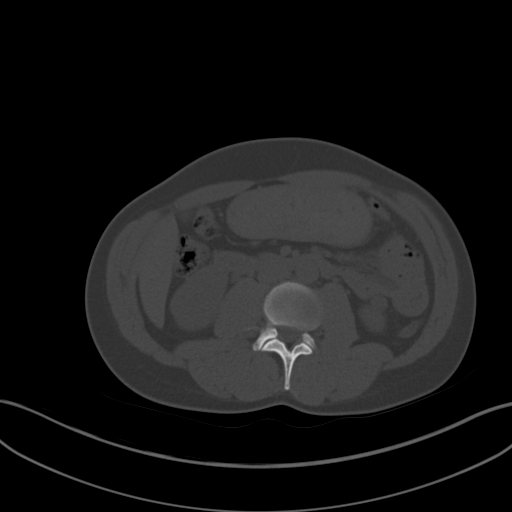
[im 62/86  soft-tissue]
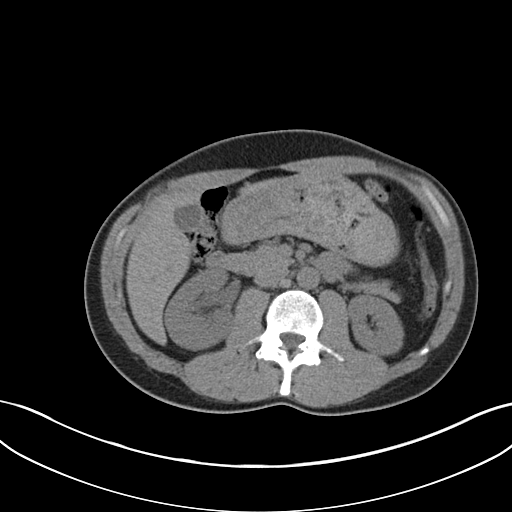
[im 67/86  soft-tissue]
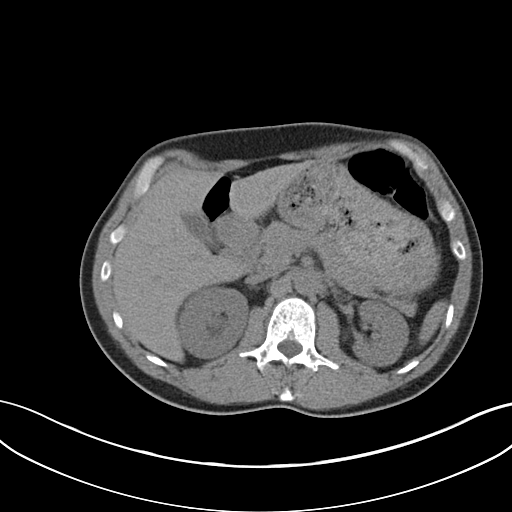
[im 76/86  soft-tissue]
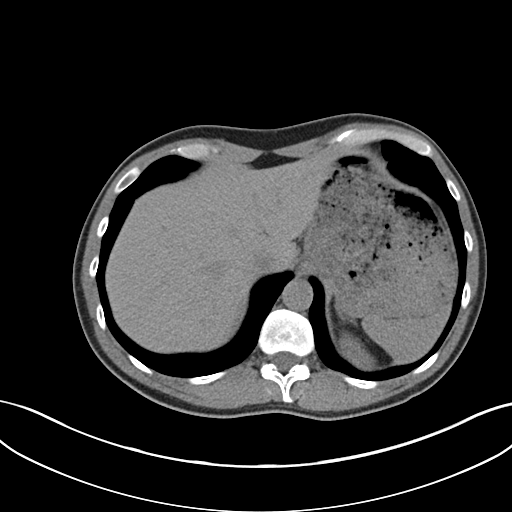
[im 81/86  soft-tissue]
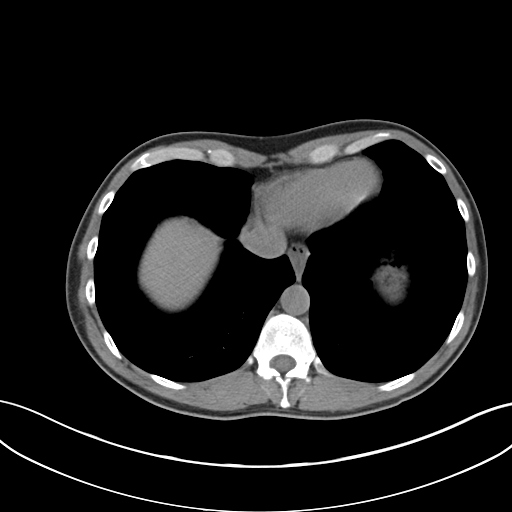

[Series 4: coronal · coronal · 0.69mm/px · 3 of 126 slices shown]
[im 42/126  soft-tissue]
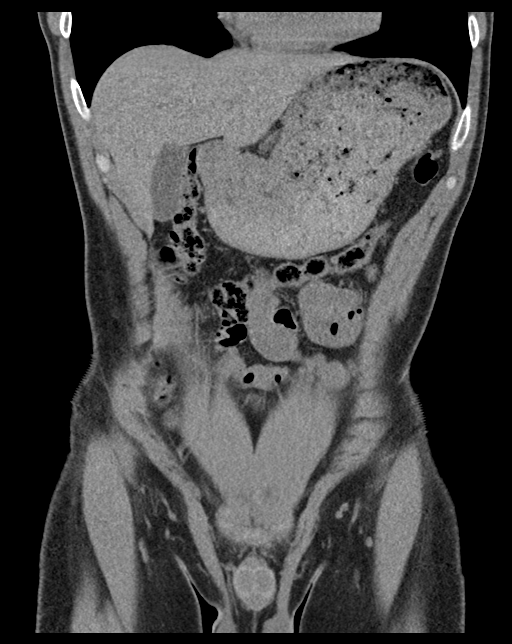
[im 56/126  soft-tissue]
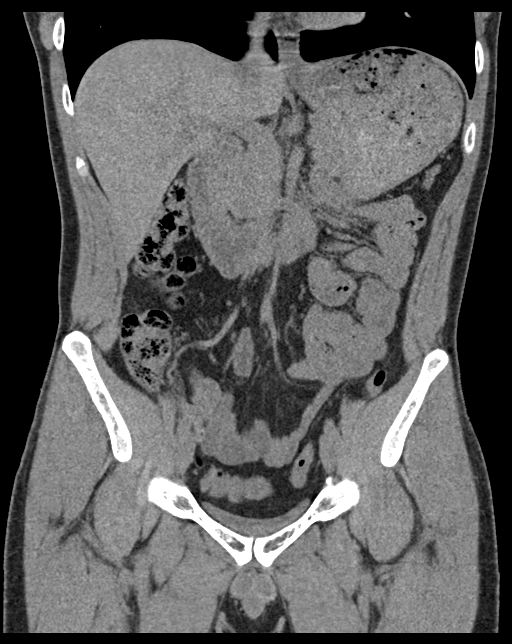
[im 70/126  soft-tissue]
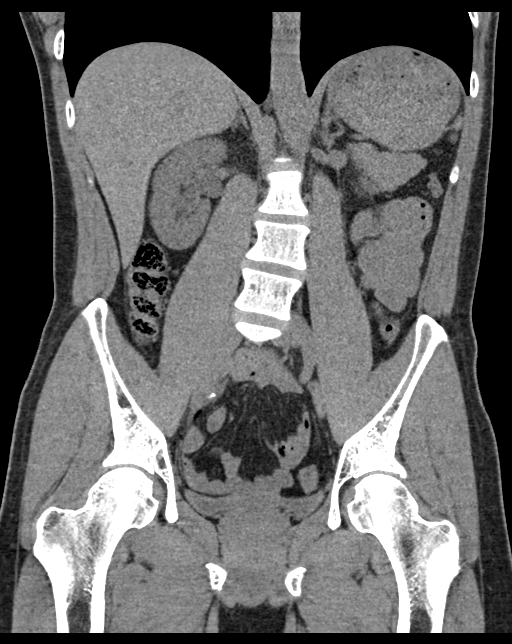

[16 of 46 positions shown; findings below may reference images not displayed]

FINDINGS: Lower chest: No acute abnormality.

Hepatobiliary: No focal liver abnormality is seen. The gallbladder
is unremarkable.

Pancreas: Unremarkable. No pancreatic ductal dilatation or
surrounding inflammatory changes.

Spleen: Normal in size without focal abnormality.

Adrenals/Urinary Tract: Adrenal glands are unremarkable. There is
right-sided hydroureteronephrosis due to an obstructing 5 mm stone
in the distal right ureter. Bladder is unremarkable.

Stomach/Bowel: The stomach is distended with recently ingested
material. There is no evidence of bowel obstruction.Prior
appendectomy.

Vascular/Lymphatic: No significant vascular findings are present. No
enlarged abdominal or pelvic lymph nodes.

Reproductive: Unremarkable.

Other: No abdominal wall hernia or abnormality. No abdominopelvic
ascites.

Musculoskeletal: No acute osseous abnormality. There are bilateral
cam deformities of the hips.
IMPRESSION: Obstructing 5 mm stone in the distal right ureter with upstream
right-sided hydroureteronephrosis.
